# Patient Record
Sex: Male | Born: 1975 | Race: Black or African American | Hispanic: No | Smoking: Never smoker
Health system: Southern US, Community
[De-identification: ages and names within clinical notes are randomized; demographics above are authoritative.]

## PROBLEM LIST (undated history)

## (undated) DIAGNOSIS — S14105A Unspecified injury at C5 level of cervical spinal cord, initial encounter: Secondary | ICD-10-CM

## (undated) DIAGNOSIS — Z21 Asymptomatic human immunodeficiency virus [HIV] infection status: Secondary | ICD-10-CM

## (undated) DIAGNOSIS — K589 Irritable bowel syndrome without diarrhea: Secondary | ICD-10-CM

## (undated) DIAGNOSIS — G629 Polyneuropathy, unspecified: Secondary | ICD-10-CM

## (undated) DIAGNOSIS — F32A Depression, unspecified: Secondary | ICD-10-CM

## (undated) DIAGNOSIS — M4802 Spinal stenosis, cervical region: Secondary | ICD-10-CM

## (undated) DIAGNOSIS — Z8619 Personal history of other infectious and parasitic diseases: Secondary | ICD-10-CM

## (undated) DIAGNOSIS — B20 Human immunodeficiency virus [HIV] disease: Secondary | ICD-10-CM

## (undated) HISTORY — PX: COLONOSCOPY: SHX174

## (undated) HISTORY — PX: HERNIA REPAIR: SHX51

---

## 2006-06-14 ENCOUNTER — Ambulatory Visit: Payer: Self-pay | Admitting: Internal Medicine

## 2006-06-14 ENCOUNTER — Encounter: Admission: RE | Admit: 2006-06-14 | Discharge: 2006-06-14 | Payer: Self-pay | Admitting: Internal Medicine

## 2006-06-14 DIAGNOSIS — B20 Human immunodeficiency virus [HIV] disease: Secondary | ICD-10-CM | POA: Insufficient documentation

## 2006-06-14 LAB — CONVERTED CEMR LAB
AST: 15 units/L (ref 0–37)
Alkaline Phosphatase: 54 units/L (ref 39–117)
BUN: 20 mg/dL (ref 6–23)
Basophils Relative: 1 % (ref 0–1)
Bilirubin Urine: NEGATIVE
Creatinine, Ser: 1.3 mg/dL (ref 0.40–1.50)
Eosinophils Absolute: 0.6 10*3/uL (ref 0.0–0.7)
Eosinophils Relative: 8 % — ABNORMAL HIGH (ref 0–5)
Glucose, Bld: 74 mg/dL (ref 70–99)
HCT: 44.3 % (ref 39.0–52.0)
HCV Ab: NEGATIVE
HIV-1 RNA Quant, Log: 3.16 — ABNORMAL HIGH (ref <1.70)
HIV-1 antibody: POSITIVE — AB
HIV-2 Ab: UNDETERMINED — AB
Hemoglobin, Urine: NEGATIVE
Hemoglobin: 14.8 g/dL (ref 13.0–17.0)
Hep B Core Total Ab: POSITIVE — AB
Hep B S Ab: NEGATIVE
MCHC: 33.4 g/dL (ref 30.0–36.0)
MCV: 87.2 fL (ref 78.0–100.0)
Monocytes Absolute: 0.9 10*3/uL — ABNORMAL HIGH (ref 0.2–0.7)
Monocytes Relative: 12 % — ABNORMAL HIGH (ref 3–11)
Neutrophils Relative %: 46 % (ref 43–77)
Protein, ur: NEGATIVE mg/dL
RBC: 5.08 M/uL (ref 4.22–5.81)
Total Bilirubin: 0.4 mg/dL (ref 0.3–1.2)
Urine Glucose: NEGATIVE mg/dL
pH: 6.5 (ref 5.0–8.0)

## 2006-06-19 DIAGNOSIS — Z8619 Personal history of other infectious and parasitic diseases: Secondary | ICD-10-CM | POA: Insufficient documentation

## 2006-06-19 DIAGNOSIS — B009 Herpesviral infection, unspecified: Secondary | ICD-10-CM | POA: Insufficient documentation

## 2006-07-09 ENCOUNTER — Encounter (INDEPENDENT_AMBULATORY_CARE_PROVIDER_SITE_OTHER): Payer: Self-pay | Admitting: Infectious Diseases

## 2006-07-09 ENCOUNTER — Encounter (INDEPENDENT_AMBULATORY_CARE_PROVIDER_SITE_OTHER): Payer: Self-pay | Admitting: *Deleted

## 2006-07-11 ENCOUNTER — Encounter (INDEPENDENT_AMBULATORY_CARE_PROVIDER_SITE_OTHER): Payer: Self-pay | Admitting: *Deleted

## 2006-07-11 ENCOUNTER — Ambulatory Visit: Payer: Self-pay | Admitting: Internal Medicine

## 2006-07-11 ENCOUNTER — Telehealth: Payer: Self-pay

## 2006-07-11 ENCOUNTER — Telehealth: Payer: Self-pay | Admitting: Internal Medicine

## 2006-07-11 DIAGNOSIS — K4090 Unilateral inguinal hernia, without obstruction or gangrene, not specified as recurrent: Secondary | ICD-10-CM | POA: Insufficient documentation

## 2006-07-18 ENCOUNTER — Ambulatory Visit: Payer: Self-pay | Admitting: Internal Medicine

## 2006-07-25 ENCOUNTER — Ambulatory Visit: Payer: Self-pay | Admitting: Internal Medicine

## 2006-08-01 ENCOUNTER — Encounter (INDEPENDENT_AMBULATORY_CARE_PROVIDER_SITE_OTHER): Payer: Self-pay | Admitting: *Deleted

## 2006-09-10 ENCOUNTER — Ambulatory Visit: Payer: Self-pay | Admitting: Internal Medicine

## 2006-09-10 ENCOUNTER — Encounter: Admission: RE | Admit: 2006-09-10 | Discharge: 2006-09-10 | Payer: Self-pay | Admitting: Internal Medicine

## 2006-09-10 LAB — CONVERTED CEMR LAB
Alkaline Phosphatase: 53 units/L (ref 39–117)
BUN: 18 mg/dL (ref 6–23)
CO2: 24 meq/L (ref 19–32)
Creatinine, Ser: 1.13 mg/dL (ref 0.40–1.50)
Eosinophils Absolute: 0.3 10*3/uL (ref 0.0–0.7)
Eosinophils Relative: 6 % — ABNORMAL HIGH (ref 0–5)
Glucose, Bld: 80 mg/dL (ref 70–99)
HCT: 43.3 % (ref 39.0–52.0)
HIV 1 RNA Quant: 1730 copies/mL — ABNORMAL HIGH (ref <50)
HIV-1 RNA Quant, Log: 3.24 — ABNORMAL HIGH (ref <1.70)
Hemoglobin: 14.4 g/dL (ref 13.0–17.0)
Lymphocytes Relative: 48 % — ABNORMAL HIGH (ref 12–46)
Lymphs Abs: 2.5 10*3/uL (ref 0.7–3.3)
MCV: 88.5 fL (ref 78.0–100.0)
Monocytes Absolute: 0.6 10*3/uL (ref 0.2–0.7)
Platelets: 256 10*3/uL (ref 150–400)
Sodium: 142 meq/L (ref 135–145)
Total Bilirubin: 0.4 mg/dL (ref 0.3–1.2)
Total Protein: 6.8 g/dL (ref 6.0–8.3)
WBC: 5.2 10*3/uL (ref 4.0–10.5)

## 2006-10-01 ENCOUNTER — Encounter (INDEPENDENT_AMBULATORY_CARE_PROVIDER_SITE_OTHER): Payer: Self-pay | Admitting: *Deleted

## 2006-10-01 LAB — CONVERTED CEMR LAB: Hep B S Ab: POSITIVE

## 2006-10-02 ENCOUNTER — Ambulatory Visit: Payer: Self-pay | Admitting: Internal Medicine

## 2006-10-02 DIAGNOSIS — R21 Rash and other nonspecific skin eruption: Secondary | ICD-10-CM | POA: Insufficient documentation

## 2006-10-05 ENCOUNTER — Telehealth: Payer: Self-pay | Admitting: Internal Medicine

## 2007-01-01 ENCOUNTER — Encounter: Admission: RE | Admit: 2007-01-01 | Discharge: 2007-01-01 | Payer: Self-pay | Admitting: Internal Medicine

## 2007-01-01 ENCOUNTER — Ambulatory Visit: Payer: Self-pay | Admitting: Internal Medicine

## 2007-01-01 LAB — CONVERTED CEMR LAB
AST: 21 units/L (ref 0–37)
Albumin: 4.3 g/dL (ref 3.5–5.2)
BUN: 21 mg/dL (ref 6–23)
Basophils Relative: 1 % (ref 0–1)
CO2: 24 meq/L (ref 19–32)
Calcium: 9.2 mg/dL (ref 8.4–10.5)
Chloride: 106 meq/L (ref 96–112)
Creatinine, Ser: 1.19 mg/dL (ref 0.40–1.50)
HIV-1 RNA Quant, Log: 2.96 — ABNORMAL HIGH (ref <1.70)
Hemoglobin: 13.8 g/dL (ref 13.0–17.0)
Lymphocytes Relative: 43 % (ref 12–46)
Lymphs Abs: 2.7 10*3/uL (ref 0.7–4.0)
MCHC: 33.1 g/dL (ref 30.0–36.0)
Monocytes Absolute: 0.7 10*3/uL (ref 0.1–1.0)
Monocytes Relative: 11 % (ref 3–12)
Neutro Abs: 2.4 10*3/uL (ref 1.7–7.7)
Neutrophils Relative %: 39 % — ABNORMAL LOW (ref 43–77)
Potassium: 3.7 meq/L (ref 3.5–5.3)
RBC: 4.67 M/uL (ref 4.22–5.81)
WBC: 6.2 10*3/uL (ref 4.0–10.5)

## 2007-01-14 ENCOUNTER — Encounter (INDEPENDENT_AMBULATORY_CARE_PROVIDER_SITE_OTHER): Payer: Self-pay | Admitting: *Deleted

## 2007-01-15 ENCOUNTER — Encounter (INDEPENDENT_AMBULATORY_CARE_PROVIDER_SITE_OTHER): Payer: Self-pay | Admitting: *Deleted

## 2007-01-15 ENCOUNTER — Encounter: Payer: Self-pay | Admitting: Internal Medicine

## 2007-01-16 ENCOUNTER — Ambulatory Visit: Payer: Self-pay | Admitting: Internal Medicine

## 2007-04-10 ENCOUNTER — Telehealth: Payer: Self-pay | Admitting: Internal Medicine

## 2007-04-12 ENCOUNTER — Ambulatory Visit: Payer: Self-pay | Admitting: Internal Medicine

## 2007-07-03 ENCOUNTER — Ambulatory Visit: Payer: Self-pay | Admitting: Internal Medicine

## 2007-07-03 ENCOUNTER — Encounter: Admission: RE | Admit: 2007-07-03 | Discharge: 2007-07-03 | Payer: Self-pay | Admitting: Internal Medicine

## 2007-07-03 LAB — CONVERTED CEMR LAB
ALT: 11 units/L (ref 0–53)
AST: 20 units/L (ref 0–37)
Alkaline Phosphatase: 57 units/L (ref 39–117)
Basophils Absolute: 0 10*3/uL (ref 0.0–0.1)
Eosinophils Absolute: 0.3 10*3/uL (ref 0.0–0.7)
Eosinophils Relative: 5 % (ref 0–5)
HCT: 42 % (ref 39.0–52.0)
MCV: 87 fL (ref 78.0–100.0)
Neutrophils Relative %: 43 % (ref 43–77)
Platelets: 248 10*3/uL (ref 150–400)
Potassium: 4.1 meq/L (ref 3.5–5.3)
RDW: 13.7 % (ref 11.5–15.5)
Sodium: 143 meq/L (ref 135–145)
Total Bilirubin: 0.5 mg/dL (ref 0.3–1.2)
Total Protein: 7.5 g/dL (ref 6.0–8.3)

## 2007-07-17 ENCOUNTER — Ambulatory Visit: Payer: Self-pay | Admitting: Internal Medicine

## 2007-09-02 ENCOUNTER — Telehealth: Payer: Self-pay | Admitting: Internal Medicine

## 2007-09-03 ENCOUNTER — Ambulatory Visit: Payer: Self-pay | Admitting: Internal Medicine

## 2007-09-03 DIAGNOSIS — K029 Dental caries, unspecified: Secondary | ICD-10-CM | POA: Insufficient documentation

## 2007-09-03 DIAGNOSIS — K047 Periapical abscess without sinus: Secondary | ICD-10-CM | POA: Insufficient documentation

## 2007-11-06 ENCOUNTER — Telehealth: Payer: Self-pay | Admitting: Internal Medicine

## 2007-11-07 ENCOUNTER — Telehealth: Payer: Self-pay | Admitting: Internal Medicine

## 2007-11-11 ENCOUNTER — Ambulatory Visit: Payer: Self-pay | Admitting: Internal Medicine

## 2007-11-21 ENCOUNTER — Encounter (INDEPENDENT_AMBULATORY_CARE_PROVIDER_SITE_OTHER): Payer: Self-pay | Admitting: *Deleted

## 2007-12-03 ENCOUNTER — Ambulatory Visit: Payer: Self-pay | Admitting: Infectious Diseases

## 2007-12-03 ENCOUNTER — Telehealth (INDEPENDENT_AMBULATORY_CARE_PROVIDER_SITE_OTHER): Payer: Self-pay | Admitting: *Deleted

## 2007-12-04 ENCOUNTER — Encounter (INDEPENDENT_AMBULATORY_CARE_PROVIDER_SITE_OTHER): Payer: Self-pay | Admitting: *Deleted

## 2008-01-14 ENCOUNTER — Ambulatory Visit: Payer: Self-pay | Admitting: Infectious Diseases

## 2008-01-14 ENCOUNTER — Encounter: Payer: Self-pay | Admitting: Internal Medicine

## 2008-01-14 LAB — CONVERTED CEMR LAB
ALT: 13 units/L (ref 0–53)
Basophils Absolute: 0 10*3/uL (ref 0.0–0.1)
Basophils Relative: 0 % (ref 0–1)
CO2: 25 meq/L (ref 19–32)
Creatinine, Ser: 1.45 mg/dL (ref 0.40–1.50)
Eosinophils Absolute: 0.3 10*3/uL (ref 0.0–0.7)
Eosinophils Relative: 4 % (ref 0–5)
HCT: 43.5 % (ref 39.0–52.0)
HIV 1 RNA Quant: 2050 copies/mL — ABNORMAL HIGH (ref <48)
Hemoglobin: 14.5 g/dL (ref 13.0–17.0)
Lymphocytes Relative: 39 % (ref 12–46)
MCHC: 33.3 g/dL (ref 30.0–36.0)
MCV: 87.5 fL (ref 78.0–100.0)
Monocytes Absolute: 0.7 10*3/uL (ref 0.1–1.0)
RDW: 13.7 % (ref 11.5–15.5)
Total Bilirubin: 0.4 mg/dL (ref 0.3–1.2)

## 2008-01-21 ENCOUNTER — Telehealth (INDEPENDENT_AMBULATORY_CARE_PROVIDER_SITE_OTHER): Payer: Self-pay | Admitting: *Deleted

## 2008-01-21 ENCOUNTER — Encounter (INDEPENDENT_AMBULATORY_CARE_PROVIDER_SITE_OTHER): Payer: Self-pay | Admitting: *Deleted

## 2008-01-21 ENCOUNTER — Telehealth: Payer: Self-pay | Admitting: Internal Medicine

## 2008-01-31 ENCOUNTER — Ambulatory Visit: Payer: Self-pay | Admitting: Internal Medicine

## 2008-04-10 ENCOUNTER — Ambulatory Visit: Payer: Self-pay | Admitting: Internal Medicine

## 2008-04-10 LAB — CONVERTED CEMR LAB
ALT: 17 units/L (ref 0–53)
AST: 24 units/L (ref 0–37)
Albumin: 4.6 g/dL (ref 3.5–5.2)
BUN: 20 mg/dL (ref 6–23)
Basophils Relative: 0 % (ref 0–1)
Calcium: 9.4 mg/dL (ref 8.4–10.5)
Chloride: 106 meq/L (ref 96–112)
Eosinophils Absolute: 0.4 10*3/uL (ref 0.0–0.7)
MCHC: 34.9 g/dL (ref 30.0–36.0)
MCV: 86.5 fL (ref 78.0–100.0)
Neutrophils Relative %: 40 % — ABNORMAL LOW (ref 43–77)
Platelets: 236 10*3/uL (ref 150–400)
Potassium: 3.9 meq/L (ref 3.5–5.3)
RDW: 12.9 % (ref 11.5–15.5)
WBC: 6.7 10*3/uL (ref 4.0–10.5)

## 2008-05-01 ENCOUNTER — Ambulatory Visit: Payer: Self-pay | Admitting: Internal Medicine

## 2008-05-01 ENCOUNTER — Encounter (INDEPENDENT_AMBULATORY_CARE_PROVIDER_SITE_OTHER): Payer: Self-pay | Admitting: Licensed Clinical Social Worker

## 2008-05-01 DIAGNOSIS — G4700 Insomnia, unspecified: Secondary | ICD-10-CM | POA: Insufficient documentation

## 2008-05-20 ENCOUNTER — Ambulatory Visit: Payer: Self-pay | Admitting: Internal Medicine

## 2008-05-20 DIAGNOSIS — N342 Other urethritis: Secondary | ICD-10-CM | POA: Insufficient documentation

## 2008-05-20 LAB — CONVERTED CEMR LAB
Glucose, Urine, Semiquant: NEGATIVE
Ketones, urine, test strip: NEGATIVE
Nitrite: NEGATIVE
Protein, U semiquant: 30
Urobilinogen, UA: 0.2

## 2008-05-22 ENCOUNTER — Encounter (INDEPENDENT_AMBULATORY_CARE_PROVIDER_SITE_OTHER): Payer: Self-pay | Admitting: Licensed Clinical Social Worker

## 2008-05-22 LAB — CONVERTED CEMR LAB: GC Probe Amp, Urine: POSITIVE — AB

## 2008-05-28 ENCOUNTER — Telehealth: Payer: Self-pay | Admitting: Internal Medicine

## 2008-07-30 ENCOUNTER — Ambulatory Visit: Payer: Self-pay | Admitting: Internal Medicine

## 2008-07-30 LAB — CONVERTED CEMR LAB
ALT: 13 units/L (ref 0–53)
AST: 18 units/L (ref 0–37)
Basophils Absolute: 0 10*3/uL (ref 0.0–0.1)
Basophils Relative: 0 % (ref 0–1)
CO2: 26 meq/L (ref 19–32)
Chloride: 106 meq/L (ref 96–112)
Eosinophils Absolute: 0.3 10*3/uL (ref 0.0–0.7)
HIV-1 RNA Quant, Log: 3.13 — ABNORMAL HIGH (ref <1.68)
Lymphs Abs: 2.5 10*3/uL (ref 0.7–4.0)
MCV: 86.2 fL (ref 78.0–100.0)
Neutrophils Relative %: 50 % (ref 43–77)
Platelets: 235 10*3/uL (ref 150–400)
RDW: 13.5 % (ref 11.5–15.5)
Sodium: 142 meq/L (ref 135–145)
Total Bilirubin: 0.4 mg/dL (ref 0.3–1.2)
Total Protein: 7.3 g/dL (ref 6.0–8.3)
WBC: 7 10*3/uL (ref 4.0–10.5)

## 2008-08-14 ENCOUNTER — Ambulatory Visit: Payer: Self-pay | Admitting: Internal Medicine

## 2008-08-18 ENCOUNTER — Telehealth: Payer: Self-pay | Admitting: Internal Medicine

## 2008-12-09 ENCOUNTER — Ambulatory Visit: Payer: Self-pay | Admitting: Internal Medicine

## 2008-12-09 LAB — CONVERTED CEMR LAB
BUN: 21 mg/dL (ref 6–23)
CO2: 23 meq/L (ref 19–32)
Calcium: 9.8 mg/dL (ref 8.4–10.5)
Chloride: 107 meq/L (ref 96–112)
Creatinine, Ser: 1.15 mg/dL (ref 0.40–1.50)
Eosinophils Absolute: 0.3 10*3/uL (ref 0.0–0.7)
Eosinophils Relative: 5 % (ref 0–5)
Glucose, Bld: 75 mg/dL (ref 70–99)
HCT: 44.1 % (ref 39.0–52.0)
HIV-1 RNA Quant, Log: 3.2 — ABNORMAL HIGH (ref <1.68)
Lymphocytes Relative: 41 % (ref 12–46)
Lymphs Abs: 2.4 10*3/uL (ref 0.7–4.0)
MCV: 86.5 fL (ref >=78.0)
Monocytes Relative: 12 % (ref 3–12)
RBC: 5.1 M/uL (ref 4.22–5.81)
Total Bilirubin: 0.3 mg/dL (ref 0.3–1.2)
WBC: 6 10*3/uL (ref 4.0–10.5)

## 2009-01-01 ENCOUNTER — Ambulatory Visit: Payer: Self-pay | Admitting: Internal Medicine

## 2009-02-05 ENCOUNTER — Telehealth: Payer: Self-pay | Admitting: Internal Medicine

## 2009-03-19 ENCOUNTER — Encounter: Payer: Self-pay | Admitting: Internal Medicine

## 2009-03-24 ENCOUNTER — Ambulatory Visit: Payer: Self-pay | Admitting: Internal Medicine

## 2009-03-24 LAB — CONVERTED CEMR LAB
ALT: <8 units/L (ref 0–53)
AST: 15 units/L (ref 0–37)
Albumin: 4.7 g/dL (ref 3.5–5.2)
CO2: 24 meq/L (ref 19–32)
Calcium: 9.2 mg/dL (ref 8.4–10.5)
Chloride: 103 meq/L (ref 96–112)
Eosinophils Absolute: 0.2 10*3/uL (ref 0.0–0.7)
HCT: 43.9 % (ref 39.0–52.0)
HIV 1 RNA Quant: 2760 copies/mL — ABNORMAL HIGH (ref <48)
Lymphocytes Relative: 45 % (ref 12–46)
Lymphs Abs: 2.4 10*3/uL (ref 0.7–4.0)
MCV: 85.7 fL (ref >=78.0)
Monocytes Relative: 9 % (ref 3–12)
Neutrophils Relative %: 42 % — ABNORMAL LOW (ref 43–77)
Platelets: 249 10*3/uL (ref 150–400)
Potassium: 3.9 meq/L (ref 3.5–5.3)
RBC: 5.12 M/uL (ref 4.22–5.81)
Total Protein: 7.6 g/dL (ref 6.0–8.3)
WBC: 5.3 10*3/uL (ref 4.0–10.5)

## 2009-04-12 ENCOUNTER — Ambulatory Visit: Payer: Self-pay | Admitting: Internal Medicine

## 2009-04-26 ENCOUNTER — Telehealth: Payer: Self-pay | Admitting: Internal Medicine

## 2009-05-12 ENCOUNTER — Ambulatory Visit: Payer: Self-pay | Admitting: Internal Medicine

## 2009-05-27 ENCOUNTER — Encounter: Payer: Self-pay | Admitting: Internal Medicine

## 2009-06-16 ENCOUNTER — Telehealth: Payer: Self-pay | Admitting: Internal Medicine

## 2009-11-12 ENCOUNTER — Ambulatory Visit: Payer: Self-pay | Admitting: Internal Medicine

## 2009-12-20 ENCOUNTER — Encounter (INDEPENDENT_AMBULATORY_CARE_PROVIDER_SITE_OTHER): Payer: Self-pay | Admitting: *Deleted

## 2010-02-16 NOTE — Assessment & Plan Note (Signed)
Summary: 3 MONTH CHECK UP/CFB   Primary Provider:  Yisroel Ramming MD  CC:  follow-up visit and lab results.  History of Present Illness: Pt her for lab results.  He feels well. No new problems.  Preventive Screening-Counseling & Management  Alcohol-Tobacco     Alcohol drinks/day: 0     Smoking Status: never     Packs/Day: occ  Caffeine-Diet-Exercise     Caffeine use/day: sodas     Does Patient Exercise: no     Exercise (avg: min/session): <30  Safety-Violence-Falls     Seat Belt Use: yes      Sexual History:  currently monogamous.     Current Allergies (reviewed today): No known allergies  Past History:  Past Medical History: Last updated: 12/03/2007 HIV disease dental abscess  Review of Systems  The patient denies anorexia, fever, and weight loss.    Vital Signs:  Patient profile:   35 year old male Height:      74 inches (187.96 cm) Weight:      138.7 pounds (63.05 kg) BMI:     17.87 Temp:     98.5 degrees F (36.94 degrees C) oral Pulse rate:   63 / minute BP sitting:   142 / 90  (left arm)  Vitals Entered By: Wendall Mola CMA Duncan Dull) (April 12, 2009 3:06 PM) CC: follow-up visit, lab results Is Patient Diabetic? No Pain Assessment Patient in pain? no      Nutritional Status BMI of < 19 = underweight Nutritional Status Detail appetite "normal"  Does patient need assistance? Functional Status Self care Ambulation Normal   Physical Exam  General:  alert, well-developed, well-nourished, and well-hydrated.   Head:  normocephalic and atraumatic.   Mouth:  pharynx pink and moist.   Lungs:  normal breath sounds.     Impression & Recommendations:  Problem # 1:  HIV INFECTION (ICD-042) Pt.s CD4ct dropped to 390. Discussed starting antiretroviral therapy.  Pt agrees. Discussed treatment options.  Will add a genotype and have patient return in 2-3 weeks to get started.  Refer to Byrd Hesselbach to get him enrolled in ADAP. Orders: Est. Patient Level  III ([address])  Diagnostics Reviewed:  HIV: HIV positive - not AIDS (08/14/2008)   HIV-Western blot: Positive (06/14/2006)   CD4: 390 (03/25/2009)   WBC: 5.3 (03/24/2009)   Hgb: 14.8 (03/24/2009)   HCT: 43.9 (03/24/2009)   Platelets: 249 (03/24/2009) HIV-1 RNA: 2760 (03/24/2009)   HBSAg: NEG (06/14/2006)  Other Orders: T-HIV Genotype ([id number])  Patient Instructions: 1)  Please schedule a follow-up appointment in 2 - 3 weeks. Process Orders Check Orders Results:     [facility]: ABN not required for this insurance Tests Sent for requisitioning (April 12, 2009 4:22 PM):     04/12/2009: [facility] -- T-HIV Genotype [phone] (signed)

## 2010-02-16 NOTE — Progress Notes (Signed)
Summary: new script/drug coverage now.  Phone Note Call from Patient   Caller: Spouse Details for Reason: Pt husband now has pharmacy coverage. Summary of Call: Pt's wife called to inform me that her husband now has active drug coverage through [employer] and would like his prescription to be [facility] [address] [address]  I informed her that I will cancel the ADAP we started.  Patient was currently on the waitlist. Initial call taken by: Paulo Fruit  BS,CPht II,MPH,  June 16, 2009 4:36 PM    Prescriptions: ATRIPLA 600-200-300 MG TABS (EFAVIRENZ-EMTRICITAB-TENOFOVIR) Take 1 tablet by mouth at bedtime  #30 x 6   Entered by:   Paulo Fruit  BS,CPht II,MPH   Authorized by:   Yisroel Ramming MD   Signed by:   Paulo Fruit  BS,CPht II,MPH on 06/16/2009   Method used:   [facility] to        [phone][facility] (retail)       [address] [place], [place]  [address]       Ph: [phone]       Fax: [phone]   RxID:   [id number]  Paulo Fruit  BS,CPht II,MPH  June 16, 2009 4:37 PM

## 2010-02-16 NOTE — Miscellaneous (Signed)
Summary: ADAP [place]   Clinical Lists Changes  Observations: Added new observation of AIDSDAP: Yes 2011 (12/20/2009 15:18)

## 2010-02-16 NOTE — Assessment & Plan Note (Signed)
Summary: 3WK F/U/VS   Primary Provider:  Yisroel Ramming MD  CC:  follow-up visit and get started on meds.  History of Present Illness: Pt ready to get started on HIV medications. They brought all the information needed to get him enrolled in ADAP. No new problems.  Preventive Screening-Counseling & Management  Alcohol-Tobacco     Alcohol drinks/day: 0     Smoking Status: never     Packs/Day: occ  Caffeine-Diet-Exercise     Caffeine use/day: sodas     Does Patient Exercise: yes     Type of exercise: walking     Exercise (avg: min/session): <30     Times/week: 7  Safety-Violence-Falls     Seat Belt Use: yes      Sexual History:  currently monogamous.        Drug Use:  No.        Blood Transfusions:  no.        Travel History:  no.     Current Allergies (reviewed today): No known allergies  Past History:  Past Medical History: Last updated: 12/03/2007 HIV disease dental abscess  Review of Systems  The patient denies anorexia, fever, and weight loss.    Vital Signs:  Patient profile:   35 year old male Height:      74 inches (187.96 cm) Weight:      137.8 pounds (62.64 kg) BMI:     17.76 Temp:     98.1 degrees F (36.72 degrees C) oral Pulse rate:   81 / minute BP sitting:   119 / 78  (left arm)  Vitals Entered By: Wendall Mola CMA Duncan Dull) (May 12, 2009 3:27 PM) CC: follow-up visit, get started on meds Is Patient Diabetic? No Pain Assessment Patient in pain? no      Nutritional Status BMI of < 19 = underweight Nutritional Status Detail appetite "ok"  Does patient need assistance? Functional Status Self care Ambulation Normal   Physical Exam  General:  alert, well-developed, well-nourished, and well-hydrated.   Head:  normocephalic and atraumatic.   Mouth:  pharynx pink and moist.   Lungs:  normal breath sounds.     Impression & Recommendations:  Problem # 1:  HIV DISEASE (ICD-042)  Pt with niave virus.  Discussed treatment  options.  Pt would like to start Atripla. Discussed potential side effects.  Will have him see Byrd Hesselbach to get enrolled in ADAP. Once enrolled will start Atripla and then return in 6 weeks for labs. Diagnostics Reviewed:  HIV: HIV positive - not AIDS (08/14/2008)   HIV-Western blot: Positive (06/14/2006)   CD4: 390 (03/25/2009)   WBC: 5.3 (03/24/2009)   Hgb: 14.8 (03/24/2009)   HCT: 43.9 (03/24/2009)   Platelets: 249 (03/24/2009) HIV genotype: * (04/12/2009)   HIV-1 RNA: 2760 (03/24/2009)   HBSAg: NEG (06/14/2006)  Orders: Est. Patient Level IV ([address])  Medications Added to Medication List This Visit: 1)  Atripla 600-200-300 Mg Tabs (Efavirenz-emtricitab-tenofovir) .... Take 1 tablet by mouth at bedtime

## 2010-02-16 NOTE — Assessment & Plan Note (Signed)
Summary: FLU SHOT[MKJ]  Prior Medications: ATRIPLA 600-200-300 MG TABS (EFAVIRENZ-EMTRICITAB-TENOFOVIR) Take 1 tablet by mouth at bedtime Current Allergies: No known allergies  Immunizations Administered:  Influenza Vaccine # 1:    Vaccine Type: Fluvax 3+    Site: right deltoid    Mfr: GlaxoSmithKline    Dose: 0.5 ml    Route: IM    Given by: Starleen Arms CMA    Exp. Date: 04/17/2010    Lot #: [id number]    VIS given: 08/10/09 version given November 12, 2009.  Flu Vaccine Consent Questions:    Do you have a history of severe allergic reactions to this vaccine? no    Any prior history of allergic reactions to egg and/or gelatin? no    Do you have a sensitivity to the preservative Thimersol? no    Do you have a past history of Guillan-Barre Syndrome? no    Do you currently have an acute febrile illness? no    Have you ever had a severe reaction to latex? no    Vaccine information given and explained to patient? yes  Orders Added: 1)  Flu Vaccine 67yrs + [90658] 2)  Admin 1st Vaccine [81191]

## 2010-02-16 NOTE — Miscellaneous (Signed)
Summary: RW Update  Clinical Lists Changes  Observations: Added new observation of SYPHILIS TX: No (03/19/2009 12:27)

## 2010-02-16 NOTE — Progress Notes (Signed)
Summary: Cough and Sinus drainage   Phone Note Call from Patient   Caller: wife- Hector Armstrong  [phone] Summary of Call: Productive cough  present X 6 days with sinus congestion . No relief with OTC meds .  No fever. Please advise  Initial call taken by: Tomasita Morrow RN,  February 05, 2009 2:14 PM  Follow-up for Phone Call        doxycycline 100mg  two times a day for 10 days  (on $4 rx plan) Follow-up by: Yisroel Ramming MD,  February 05, 2009 3:44 PM  Additional Follow-up for Phone Call Additional follow up Details #1::        Spoke with patient.  Med to pharm.  Tomasita Morrow RN  February 05, 2009 4:33 PM     New/Updated Medications: DOXYCYCLINE HYCLATE 100 MG CAPS (DOXYCYCLINE HYCLATE) Take 1 capsule by mouth two times a day DOXYCYCLINE HYCLATE 100 MG CAPS (DOXYCYCLINE HYCLATE) Take 1 capsule by mouth two times a day for 10 days Prescriptions: DOXYCYCLINE HYCLATE 100 MG CAPS (DOXYCYCLINE HYCLATE) Take 1 capsule by mouth two times a day for 10 days  #20 x 0   Entered by:   Tomasita Morrow RN   Authorized by:   Yisroel Ramming MD   Signed by:   Tomasita Morrow RN on 02/05/2009   Method used:   Electronically to        [facility] (retail)       [address]       [place], [place]  [address]       Ph: [phone]       Fax: [phone]   RxID:   [id number]

## 2010-02-16 NOTE — Progress Notes (Signed)
Summary: Questions before starting HAART.  Phone Note Call from Patient   Caller: Spouse Summary of Call: It was recently discovered they had a bed bug infestation.  The patients wife had several bite sites but Natanael did not have any.   His wife states that his CD-4 dropped at the last OV and Dr Philipp Deputy wanted him to start medications.  She wonders if the drop in the CD-4 is associated with the bed bug infestation.  She is hesitant about starting the medications if it is unnecessary and wonders if we need to repeat labs.  Please advise. Tomasita Morrow RN  April 26, 2009 12:09 PM   Follow-up for Phone Call        not related to bed bugs If he is not ready to start HIV meds then he can repeat labs in 6 weeks.  His CD4ct has steadily been dropping so I believe the last reading was accurate. Follow-up by: Yisroel Ramming MD,  April 26, 2009 2:52 PM  Additional Follow-up for Phone Call Additional follow up Details #1::        left message to return call. Tomasita Morrow RN  April 30, 2009 11:05 AM

## 2010-02-16 NOTE — Miscellaneous (Signed)
Summary: RW Fiancial Update Paulo Fruit)  Clinical Lists Changes  Observations: Added new observation of PCTFPL: 284.65  (12/20/2009 15:24) Added new observation of HOUSEINCOME: 13244  (12/20/2009 15:24) Added new observation of FINASSESSDT: 05/12/2009  (12/20/2009 15:24)  Appended Document: RW Fiancial Update Byrd Hesselbach [place]) patient was approved for RW on 05/12/09 - expires 05/13/10

## 2010-02-16 NOTE — Letter (Signed)
Summary: [facility]  [address]   [place], [place] [address]   Phone: [phone]  Fax: [phone]    05/27/2009   To whom it may concern:  Kelan Pritt was diagnosed with HIV in May 2008. He is currently in  treatment at the [facility].        Sincerely,  Christoper Tamarick

## 2010-04-20 LAB — T-HELPER CELL (CD4) - (RCID CLINIC ONLY): CD4 T Cell Abs: 430 uL (ref 400–2700)

## 2010-04-28 LAB — T-HELPER CELL (CD4) - (RCID CLINIC ONLY)
CD4 % Helper T Cell: 21 % — ABNORMAL LOW (ref 33–55)
CD4 T Cell Abs: 630 uL (ref 400–2700)

## 2010-07-26 ENCOUNTER — Other Ambulatory Visit (INDEPENDENT_AMBULATORY_CARE_PROVIDER_SITE_OTHER): Payer: PRIVATE HEALTH INSURANCE

## 2010-07-26 DIAGNOSIS — B20 Human immunodeficiency virus [HIV] disease: Secondary | ICD-10-CM

## 2010-07-26 LAB — CBC WITH DIFFERENTIAL/PLATELET
Hemoglobin: 14.4 g/dL (ref 13.0–17.0)
Lymphocytes Relative: 35 % (ref 12–46)
Lymphs Abs: 2.4 10*3/uL (ref 0.7–4.0)
MCH: 29.8 pg (ref 26.0–34.0)
Monocytes Relative: 12 % (ref 3–12)
Neutro Abs: 3.1 10*3/uL (ref 1.7–7.7)
Neutrophils Relative %: 46 % (ref 43–77)
RBC: 4.84 MIL/uL (ref 4.22–5.81)
WBC: 6.7 10*3/uL (ref 4.0–10.5)

## 2010-07-27 LAB — T-HELPER CELL (CD4) - (RCID CLINIC ONLY)
CD4 % Helper T Cell: 26 % — ABNORMAL LOW (ref 33–55)
CD4 T Cell Abs: 590 uL (ref 400–2700)

## 2010-07-27 LAB — HIV-1 RNA QUANT-NO REFLEX-BLD: HIV-1 RNA Quant, Log: <1.3 {Log} (ref <1.30)

## 2010-07-27 LAB — COMPLETE METABOLIC PANEL WITH GFR
ALT: 8 U/L (ref 0–53)
Albumin: 4.6 g/dL (ref 3.5–5.2)
CO2: 25 mEq/L (ref 19–32)
GFR, Est African American: >60 mL/min (ref >60)
GFR, Est Non African American: >60 mL/min (ref >60)
Glucose, Bld: 82 mg/dL (ref 70–99)
Potassium: 4.2 mEq/L (ref 3.5–5.3)
Sodium: 139 mEq/L (ref 135–145)
Total Protein: 7.3 g/dL (ref 6.0–8.3)

## 2010-07-27 LAB — GC/CHLAMYDIA PROBE AMP, URINE: Chlamydia, Swab/Urine, PCR: NEGATIVE

## 2010-08-09 ENCOUNTER — Encounter: Payer: Self-pay | Admitting: Internal Medicine

## 2010-08-09 ENCOUNTER — Ambulatory Visit (INDEPENDENT_AMBULATORY_CARE_PROVIDER_SITE_OTHER): Payer: PRIVATE HEALTH INSURANCE | Admitting: Internal Medicine

## 2010-08-09 VITALS — BP 151/86 | HR 84 | Temp 98.3°F | Ht 73.0 in | Wt 134.5 lb

## 2010-08-09 DIAGNOSIS — B20 Human immunodeficiency virus [HIV] disease: Secondary | ICD-10-CM

## 2010-08-09 NOTE — Assessment & Plan Note (Signed)
HIV - tolerating well and now undetectable.  Patient doing very well.  I discussed prevention with condoms and he was supplied with them.  I discussed closer follow up.  He will return in 4 months.  I emphasized continued compliance and congratulated him on his current compliance.  I also discussed long term effects of meds and HIV including heart and kidney risks and the need to continue to monitor this and encouraged exercise and healthy eating.

## 2010-08-09 NOTE — Progress Notes (Signed)
  Subjective:    Patient ID: Hector Armstrong, male    DOB: 03-31-1975, 35 y.o.   MRN: [id number]  HPI 35 yo with history of HIV, on Atripla.  No appt for > 1 year, but has not been out of his meds.  Is undectable today and CD4 is good.  Tolerating the meds well and reports 100% compliance.  No dysphagia, no fever, no hospitalizations.  Is unemployed and looking for work.     Previously, he has had low level viremia from 2-3 log, this is his first time undetectable.  Today he has no complaints.     Review of Systems  Constitutional: Negative.   HENT: Negative.   Eyes: Negative.   Respiratory: Negative.   Cardiovascular: Negative.   Gastrointestinal: Negative.   Genitourinary: Negative.   Musculoskeletal: Negative.   Skin: Negative.   Neurological: Negative.   Hematological: Negative.   Psychiatric/Behavioral: Negative.        Objective:   Physical Exam  Constitutional: He is oriented to person, place, and time. He appears well-developed and well-nourished. No distress.       Thin   HENT:  Mouth/Throat: No oropharyngeal exudate.  Neck: Normal range of motion. Neck supple.  Cardiovascular: Normal rate, regular rhythm and normal heart sounds.  Exam reveals no gallop and no friction rub.   No murmur heard. Pulmonary/Chest: Effort normal and breath sounds normal. No respiratory distress. He has no wheezes.  Abdominal: Soft. Bowel sounds are normal. He exhibits no distension.  Lymphadenopathy:    He has no cervical adenopathy.  Neurological: He is alert and oriented to person, place, and time.  Skin: Skin is warm and dry. No erythema.  Psychiatric: He has a normal mood and affect. His behavior is normal.          Assessment & Plan:

## 2010-10-13 LAB — T-HELPER CELL (CD4) - (RCID CLINIC ONLY): CD4 % Helper T Cell: 36

## 2010-10-17 ENCOUNTER — Ambulatory Visit (INDEPENDENT_AMBULATORY_CARE_PROVIDER_SITE_OTHER): Payer: PRIVATE HEALTH INSURANCE

## 2010-10-17 DIAGNOSIS — Z0000 Encounter for general adult medical examination without abnormal findings: Secondary | ICD-10-CM

## 2010-10-17 DIAGNOSIS — Z23 Encounter for immunization: Secondary | ICD-10-CM

## 2010-10-21 LAB — T-HELPER CELL (CD4) - (RCID CLINIC ONLY)
CD4 % Helper T Cell: 26 % — ABNORMAL LOW (ref 33–55)
CD4 T Cell Abs: 740 uL (ref 400–2700)

## 2011-01-31 ENCOUNTER — Telehealth: Payer: Self-pay | Admitting: *Deleted

## 2011-01-31 NOTE — Telephone Encounter (Signed)
He had it in 05/2006. Son recently had pna & now dtr has it. Wife is concerned about her spouse. Wanted to know if he can get it early. I checked with Dr. Ninetta Lights & he said ok. Transferred to front to make an appt

## 2011-02-06 ENCOUNTER — Ambulatory Visit: Payer: PRIVATE HEALTH INSURANCE

## 2011-03-29 ENCOUNTER — Other Ambulatory Visit: Payer: Self-pay | Admitting: *Deleted

## 2011-03-29 DIAGNOSIS — B20 Human immunodeficiency virus [HIV] disease: Secondary | ICD-10-CM

## 2011-03-29 MED ORDER — EFAVIRENZ-EMTRICITAB-TENOFOVIR 600-200-300 MG PO TABS: 1.0000 | ORAL_TABLET | Freq: Every day | ORAL | Status: DC

## 2011-04-17 ENCOUNTER — Ambulatory Visit: Payer: PRIVATE HEALTH INSURANCE

## 2011-04-21 ENCOUNTER — Ambulatory Visit (INDEPENDENT_AMBULATORY_CARE_PROVIDER_SITE_OTHER): Payer: PRIVATE HEALTH INSURANCE | Admitting: *Deleted

## 2011-04-21 DIAGNOSIS — Z23 Encounter for immunization: Secondary | ICD-10-CM

## 2011-04-21 DIAGNOSIS — B20 Human immunodeficiency virus [HIV] disease: Secondary | ICD-10-CM

## 2011-04-28 ENCOUNTER — Other Ambulatory Visit: Payer: PRIVATE HEALTH INSURANCE

## 2011-05-02 ENCOUNTER — Telehealth: Payer: Self-pay | Admitting: *Deleted

## 2011-05-02 NOTE — Telephone Encounter (Signed)
Called patient and left message on cell and home phone, he needs to call clinic and reschedule his lab visit this week, and if he can't make it, the office visit will need to be rescheduled also. Wendall Mola CMA

## 2011-05-12 ENCOUNTER — Ambulatory Visit: Payer: PRIVATE HEALTH INSURANCE | Admitting: Internal Medicine

## 2011-05-12 ENCOUNTER — Other Ambulatory Visit: Payer: PRIVATE HEALTH INSURANCE

## 2011-06-01 ENCOUNTER — Other Ambulatory Visit: Payer: PRIVATE HEALTH INSURANCE

## 2011-06-01 DIAGNOSIS — B20 Human immunodeficiency virus [HIV] disease: Secondary | ICD-10-CM

## 2011-06-01 LAB — COMPREHENSIVE METABOLIC PANEL
ALT: <8 U/L (ref 0–53)
Albumin: 4.4 g/dL (ref 3.5–5.2)
CO2: 27 mEq/L (ref 19–32)
Chloride: 102 mEq/L (ref 96–112)
Glucose, Bld: 83 mg/dL (ref 70–99)
Potassium: 3.6 mEq/L (ref 3.5–5.3)
Sodium: 137 mEq/L (ref 135–145)
Total Protein: 6.9 g/dL (ref 6.0–8.3)

## 2011-06-02 LAB — CBC WITH DIFFERENTIAL/PLATELET
Basophils Relative: 0 % (ref 0–1)
Eosinophils Absolute: 0.3 10*3/uL (ref 0.0–0.7)
HCT: 40.8 % (ref 39.0–52.0)
Hemoglobin: 14.6 g/dL (ref 13.0–17.0)
Lymphs Abs: 2.2 10*3/uL (ref 0.7–4.0)
MCH: 30.8 pg (ref 26.0–34.0)
MCHC: 35.8 g/dL (ref 30.0–36.0)
MCV: 86.1 fL (ref 78.0–100.0)
Monocytes Absolute: 0.6 10*3/uL (ref 0.1–1.0)
Monocytes Relative: 10 % (ref 3–12)
RBC: 4.74 MIL/uL (ref 4.22–5.81)

## 2011-06-02 LAB — T-HELPER CELL (CD4) - (RCID CLINIC ONLY): CD4 T Cell Abs: 580 uL (ref 400–2700)

## 2011-06-06 LAB — HIV-1 RNA QUANT-NO REFLEX-BLD: HIV-1 RNA Quant, Log: <1.3 {Log} (ref <1.30)

## 2011-06-15 ENCOUNTER — Ambulatory Visit: Payer: PRIVATE HEALTH INSURANCE | Admitting: Internal Medicine

## 2011-06-15 ENCOUNTER — Telehealth: Payer: Self-pay | Admitting: *Deleted

## 2011-06-15 NOTE — Telephone Encounter (Signed)
Called patient to reschedule his appt.  No working phone number, will refer to [facility] for multiple no shows and cancellations. Wendall Mola CMA

## 2011-06-21 ENCOUNTER — Ambulatory Visit: Payer: PRIVATE HEALTH INSURANCE

## 2011-07-18 ENCOUNTER — Encounter: Payer: Self-pay | Admitting: Internal Medicine

## 2011-07-18 ENCOUNTER — Ambulatory Visit (INDEPENDENT_AMBULATORY_CARE_PROVIDER_SITE_OTHER): Payer: PRIVATE HEALTH INSURANCE | Admitting: Internal Medicine

## 2011-07-18 VITALS — BP 154/95 | HR 69 | Temp 98.0°F | Ht 71.0 in | Wt 131.0 lb

## 2011-07-18 DIAGNOSIS — Z113 Encounter for screening for infections with a predominantly sexual mode of transmission: Secondary | ICD-10-CM

## 2011-07-18 DIAGNOSIS — Z79899 Other long term (current) drug therapy: Secondary | ICD-10-CM

## 2011-07-18 DIAGNOSIS — Z23 Encounter for immunization: Secondary | ICD-10-CM

## 2011-07-18 DIAGNOSIS — B20 Human immunodeficiency virus [HIV] disease: Secondary | ICD-10-CM

## 2011-07-18 NOTE — Assessment & Plan Note (Signed)
He continues to do well and now has been undetectable for a year.  This likely is due to good compliance and he is pleased with the regimen. I did discuss that he can take this regimen for decades and should continue. He is also going to establish with a primary care physician for other monitoring including blood pressure which was noted to be elevated today. He knows to come back if he has any difficulty but otherwise he will be seen in 6 months. He knows to use condoms with all sexual activity.

## 2011-07-18 NOTE — Patient Instructions (Signed)
Get your flu shot in the Fall

## 2011-07-18 NOTE — Progress Notes (Signed)
  Subjective:    Patient ID: Hector Armstrong, male    DOB: 1976/01/02, 36 y.o.   MRN: [id number]  HPI hhe comes in here for followup visit. He has not been in clinic for one year. He has been on Atripla and does report excellent compliance. He denies any missed doses since last year. He is here with his wife who assures that he gets his medication. He did have his Pneumovax done in April of this year. Last year he was seen and was undetectable for the first time and is undetectable again today. His CD4 count is over 500. He has no complaints. He is pleased with his regimen and had initially noted some vivid and bad dreams but this has since worn off.   Review of Systems  Constitutional: Negative for fever, chills, appetite change and unexpected weight change.  HENT: Negative for sore throat and trouble swallowing.   Respiratory: Negative for cough and shortness of breath.   Cardiovascular: Negative for chest pain, palpitations and leg swelling.  Gastrointestinal: Negative for nausea, abdominal pain and diarrhea.  Musculoskeletal: Negative for myalgias, joint swelling and arthralgias.  Skin: Negative for rash.  Neurological: Negative for dizziness, weakness and headaches.  Hematological: Negative for adenopathy.  Psychiatric/Behavioral: Negative for dysphoric mood. The patient is not nervous/anxious.        Objective:   Physical Exam  Constitutional: He appears well-developed and well-nourished. No distress.  HENT:  Mouth/Throat: Oropharynx is clear and moist. No oropharyngeal exudate.  Cardiovascular: Normal rate, regular rhythm and normal heart sounds.  Exam reveals no gallop and no friction rub.   No murmur heard. Pulmonary/Chest: Effort normal and breath sounds normal. No respiratory distress. He has no wheezes. He has no rales.  Abdominal: Soft. Bowel sounds are normal. He exhibits no distension. There is no tenderness. There is no rebound.  Lymphadenopathy:    He has no cervical  adenopathy.  Skin: Skin is warm and dry. No rash noted. No erythema.          Assessment & Plan:

## 2011-08-21 ENCOUNTER — Other Ambulatory Visit: Payer: Self-pay | Admitting: Licensed Clinical Social Worker

## 2011-08-21 DIAGNOSIS — B20 Human immunodeficiency virus [HIV] disease: Secondary | ICD-10-CM

## 2011-08-21 MED ORDER — EFAVIRENZ-EMTRICITAB-TENOFOVIR 600-200-300 MG PO TABS: 1.0000 | ORAL_TABLET | Freq: Every day | ORAL | Status: DC

## 2011-08-24 ENCOUNTER — Telehealth: Payer: Self-pay | Admitting: Internal Medicine

## 2011-08-24 NOTE — Telephone Encounter (Signed)
Faxed Mr. Matassa application to Atripla Patient Assistance today

## 2011-08-28 ENCOUNTER — Telehealth: Payer: Self-pay | Admitting: Internal Medicine

## 2011-08-28 NOTE — Telephone Encounter (Signed)
Called to check status with [employer] Patient Assistance.  The application should be processed today.  Will check back tomorrow on status.

## 2011-08-29 ENCOUNTER — Telehealth: Payer: Self-pay | Admitting: Internal Medicine

## 2011-08-29 NOTE — Telephone Encounter (Signed)
Received fax from [employer] Patient Assistance requesting more information.  I called [employer].  They wanted to know Hector Armstrong's status with ADAP.  I told them he has an appointment with Britta Mccreedy tomorrow.  I was told to call back tomorrow and get his information.

## 2011-08-30 ENCOUNTER — Ambulatory Visit: Payer: PRIVATE HEALTH INSURANCE

## 2011-08-30 ENCOUNTER — Telehealth: Payer: Self-pay | Admitting: Internal Medicine

## 2011-08-30 NOTE — Telephone Encounter (Signed)
Called Hector Armstrong and told her [name] has been approved for temp assistance for his Atripla.  They should be receiving his pharmacy card within the next 3-5 days.

## 2011-10-19 ENCOUNTER — Other Ambulatory Visit: Payer: Self-pay | Admitting: Licensed Clinical Social Worker

## 2011-10-30 ENCOUNTER — Telehealth: Payer: Self-pay | Admitting: Internal Medicine

## 2011-10-30 NOTE — Telephone Encounter (Signed)
Received fax from [employer] Patient Assistance.  Hector Armstrong has been approved for a 90 day supply of Atripla while waiting approval by ADAP.

## 2011-11-27 ENCOUNTER — Ambulatory Visit (INDEPENDENT_AMBULATORY_CARE_PROVIDER_SITE_OTHER): Payer: PRIVATE HEALTH INSURANCE

## 2011-11-27 DIAGNOSIS — Z23 Encounter for immunization: Secondary | ICD-10-CM

## 2011-11-30 ENCOUNTER — Telehealth: Payer: Self-pay | Admitting: Internal Medicine

## 2011-11-30 NOTE — Telephone Encounter (Signed)
Received a letter from [employer] stating Hector Armstrong has been removed from the program.  I called and spoke with his wife.  She said after Jan. 1 he will be covered on her insurance and that he has enough medication to get him through until then.

## 2012-01-01 ENCOUNTER — Other Ambulatory Visit: Payer: PRIVATE HEALTH INSURANCE

## 2012-01-15 ENCOUNTER — Ambulatory Visit: Payer: PRIVATE HEALTH INSURANCE | Admitting: Internal Medicine

## 2012-01-23 ENCOUNTER — Ambulatory Visit: Payer: Self-pay | Admitting: Internal Medicine

## 2012-02-15 ENCOUNTER — Other Ambulatory Visit (INDEPENDENT_AMBULATORY_CARE_PROVIDER_SITE_OTHER): Payer: BC Managed Care – PPO

## 2012-02-15 DIAGNOSIS — Z79899 Other long term (current) drug therapy: Secondary | ICD-10-CM

## 2012-02-15 DIAGNOSIS — B20 Human immunodeficiency virus [HIV] disease: Secondary | ICD-10-CM

## 2012-02-15 DIAGNOSIS — Z113 Encounter for screening for infections with a predominantly sexual mode of transmission: Secondary | ICD-10-CM

## 2012-02-15 LAB — CBC WITH DIFFERENTIAL/PLATELET
Basophils Relative: 1 % (ref 0–1)
Eosinophils Absolute: 0.2 10*3/uL (ref 0.0–0.7)
Eosinophils Relative: 6 % — ABNORMAL HIGH (ref 0–5)
Lymphs Abs: 1.9 10*3/uL (ref 0.7–4.0)
MCH: 29.6 pg (ref 26.0–34.0)
MCHC: 34.9 g/dL (ref 30.0–36.0)
MCV: 84.8 fL (ref 78.0–100.0)
Platelets: 268 10*3/uL (ref 150–400)
RBC: 4.87 MIL/uL (ref 4.22–5.81)
RDW: 13.6 % (ref 11.5–15.5)

## 2012-02-15 LAB — COMPLETE METABOLIC PANEL WITH GFR
ALT: 8 U/L (ref 0–53)
CO2: 26 mEq/L (ref 19–32)
Creat: 1.15 mg/dL (ref 0.50–1.35)
GFR, Est African American: >89 mL/min
GFR, Est Non African American: 81 mL/min
Total Bilirubin: 0.5 mg/dL (ref 0.3–1.2)

## 2012-02-15 LAB — LIPID PANEL
Cholesterol: 152 mg/dL (ref 0–200)
Triglycerides: 49 mg/dL (ref <150)
VLDL: 10 mg/dL (ref 0–40)

## 2012-02-15 LAB — RPR TITER: RPR Titer: 1:1 {titer}

## 2012-02-16 LAB — T.PALLIDUM AB, TOTAL: T pallidum Antibodies (TP-PA): >8 S/CO — ABNORMAL HIGH (ref <0.90)

## 2012-02-29 ENCOUNTER — Ambulatory Visit: Payer: Self-pay | Admitting: Internal Medicine

## 2012-04-18 ENCOUNTER — Other Ambulatory Visit: Payer: Self-pay | Admitting: Licensed Clinical Social Worker

## 2012-04-18 DIAGNOSIS — B20 Human immunodeficiency virus [HIV] disease: Secondary | ICD-10-CM

## 2012-04-18 MED ORDER — EFAVIRENZ-EMTRICITAB-TENOFOVIR 600-200-300 MG PO TABS: 1.0000 | ORAL_TABLET | Freq: Every day | ORAL | Status: DC

## 2012-04-23 ENCOUNTER — Encounter: Payer: Self-pay | Admitting: *Deleted

## 2012-05-09 ENCOUNTER — Other Ambulatory Visit: Payer: Self-pay | Admitting: Licensed Clinical Social Worker

## 2012-05-09 DIAGNOSIS — B20 Human immunodeficiency virus [HIV] disease: Secondary | ICD-10-CM

## 2012-05-09 MED ORDER — EFAVIRENZ-EMTRICITAB-TENOFOVIR 600-200-300 MG PO TABS: 1.0000 | ORAL_TABLET | Freq: Every day | ORAL | Status: DC

## 2012-09-13 ENCOUNTER — Telehealth: Payer: Self-pay | Admitting: *Deleted

## 2012-09-13 DIAGNOSIS — B20 Human immunodeficiency virus [HIV] disease: Secondary | ICD-10-CM

## 2012-09-13 MED ORDER — EFAVIRENZ-EMTRICITAB-TENOFOVIR 600-200-300 MG PO TABS: 1.0000 | ORAL_TABLET | Freq: Every day | ORAL | Status: DC

## 2012-09-13 NOTE — Telephone Encounter (Signed)
Pt has not been seen by Dr. Luciana Axe in over a year.  Requesting Atripla refill.  RN explained that the pt needs to be seen at least every 6 months by Dr. Luciana Axe to continue to receive the prescription medication.  HIV medications need monitoring for side effects and effects on a regular basis.  Wife verbalized understanding.  RN advised that the pt needs to make and keep lab and MD appointments within the next 30 days to continue to receive medication refills.  Lab and MD appointments were made for the pt.  RN explained that a 30-day supply of Atripla would be sent to the pharmacy with no refills.

## 2012-09-19 ENCOUNTER — Other Ambulatory Visit: Payer: BC Managed Care – PPO

## 2012-09-19 DIAGNOSIS — Z79899 Other long term (current) drug therapy: Secondary | ICD-10-CM

## 2012-09-19 DIAGNOSIS — Z113 Encounter for screening for infections with a predominantly sexual mode of transmission: Secondary | ICD-10-CM

## 2012-09-19 DIAGNOSIS — B20 Human immunodeficiency virus [HIV] disease: Secondary | ICD-10-CM

## 2012-09-19 LAB — CBC WITH DIFFERENTIAL/PLATELET
Basophils Absolute: 0 10*3/uL (ref 0.0–0.1)
Basophils Relative: 1 % (ref 0–1)
Eosinophils Absolute: 0.2 10*3/uL (ref 0.0–0.7)
Eosinophils Relative: 6 % — ABNORMAL HIGH (ref 0–5)
HCT: 40.4 % (ref 39.0–52.0)
Hemoglobin: 14.2 g/dL (ref 13.0–17.0)
MCH: 29.3 pg (ref 26.0–34.0)
MCHC: 35.1 g/dL (ref 30.0–36.0)
MCV: 83.5 fL (ref 78.0–100.0)
Monocytes Absolute: 0.4 10*3/uL (ref 0.1–1.0)
Monocytes Relative: 11 % (ref 3–12)
RDW: 14.3 % (ref 11.5–15.5)

## 2012-09-19 LAB — COMPREHENSIVE METABOLIC PANEL
ALT: 9 U/L (ref 0–53)
AST: 14 U/L (ref 0–37)
Albumin: 4.4 g/dL (ref 3.5–5.2)
Alkaline Phosphatase: 54 U/L (ref 39–117)
BUN: 15 mg/dL (ref 6–23)
Calcium: 8.9 mg/dL (ref 8.4–10.5)
Chloride: 104 mEq/L (ref 96–112)
Potassium: 3.9 mEq/L (ref 3.5–5.3)
Sodium: 139 mEq/L (ref 135–145)
Total Protein: 7 g/dL (ref 6.0–8.3)

## 2012-09-19 LAB — LIPID PANEL
LDL Cholesterol: 65 mg/dL (ref 0–99)
Triglycerides: 46 mg/dL (ref <150)
VLDL: 9 mg/dL (ref 0–40)

## 2012-09-20 LAB — HIV-1 RNA QUANT-NO REFLEX-BLD: HIV-1 RNA Quant, Log: <1.3 {Log} (ref <1.30)

## 2012-09-20 LAB — T.PALLIDUM AB, TOTAL: T pallidum Antibodies (TP-PA): >8 S/CO — ABNORMAL HIGH (ref <0.90)

## 2012-09-20 LAB — T-HELPER CELL (CD4) - (RCID CLINIC ONLY): CD4 T Cell Abs: 500 /uL (ref 400–2700)

## 2012-09-20 LAB — RPR TITER: RPR Titer: 1:1 {titer}

## 2012-10-09 ENCOUNTER — Telehealth: Payer: Self-pay | Admitting: *Deleted

## 2012-10-09 NOTE — Telephone Encounter (Signed)
Hector Armstrong came by and picked up a co-pay card for his Atripla.

## 2012-10-10 ENCOUNTER — Ambulatory Visit (INDEPENDENT_AMBULATORY_CARE_PROVIDER_SITE_OTHER): Payer: BC Managed Care – PPO | Admitting: Internal Medicine

## 2012-10-10 ENCOUNTER — Other Ambulatory Visit: Payer: Self-pay | Admitting: *Deleted

## 2012-10-10 ENCOUNTER — Encounter: Payer: Self-pay | Admitting: Internal Medicine

## 2012-10-10 VITALS — BP 148/103 | HR 90 | Temp 98.6°F | Ht 73.0 in | Wt 131.0 lb

## 2012-10-10 DIAGNOSIS — K589 Irritable bowel syndrome without diarrhea: Secondary | ICD-10-CM

## 2012-10-10 DIAGNOSIS — Z23 Encounter for immunization: Secondary | ICD-10-CM

## 2012-10-10 DIAGNOSIS — B20 Human immunodeficiency virus [HIV] disease: Secondary | ICD-10-CM

## 2012-10-10 MED ORDER — DICYCLOMINE HCL 10 MG PO CAPS: 20.0000 mg | ORAL_CAPSULE | Freq: Three times a day (TID) | ORAL | Status: DC

## 2012-10-10 MED ORDER — EFAVIRENZ-EMTRICITAB-TENOFOVIR 600-200-300 MG PO TABS: 1.0000 | ORAL_TABLET | Freq: Every day | ORAL | Status: DC

## 2012-10-10 NOTE — Progress Notes (Signed)
  Subjective:    Patient ID: Hector Armstrong, male    DOB: [birth year], 37 y.o.   MRN: [id number]  HPI He comes in for followup of his HIV. He continues on Atripla and denies any missed doses. He continues to have sporadic followup.  His viral load though remains undetectable and a CD4 count of 500. He complains of some abdominal bloating with an increased number of stools, up to 4 or 5 a day. He is never had this before and started about 2 weeks ago. It also equates with the recent addition of 2 twins babies that they adopted. He does endorse some increased stressors in his life with that and other children in the house. No weight loss, no nausea, no vomiting. No blood in his stool. He feels somewhat distressed by this increased stool output.   Review of Systems  Constitutional: Negative for fatigue.  HENT: Negative for sore throat and trouble swallowing.   Eyes: Negative for visual disturbance.  Respiratory: Negative for cough and shortness of breath.   Cardiovascular: Negative for chest pain.  Gastrointestinal: Positive for abdominal distention. Negative for nausea, vomiting and diarrhea.  Musculoskeletal: Negative for myalgias and arthralgias.  Skin: Negative for rash.  Neurological: Negative for dizziness, light-headedness and headaches.  Hematological: Negative for adenopathy.  Psychiatric/Behavioral: Positive for dysphoric mood.       Objective:   Physical Exam  Constitutional: He appears well-developed and well-nourished. No distress.  obese  HENT:  Mouth/Throat: No oropharyngeal exudate.  Eyes: No scleral icterus.  Cardiovascular: Normal rate and regular rhythm.   Murmur heard. Unchanged murmur  Pulmonary/Chest: Effort normal. No respiratory distress. He has no wheezes.  Lymphadenopathy:    He has no cervical adenopathy.  Neurological: He is alert.  Skin: No rash noted.  Psychiatric: He has a normal mood and affect. His behavior is normal.          Assessment &  Plan:

## 2012-10-10 NOTE — Assessment & Plan Note (Signed)
Suspect his history is compatible with irritable bowel syndrome. I did discuss the etiology including stress. With the bloating and stools are not loose, but increased in frequency, I will try Bentyl.  If he does not have any improvement or it worsens, he will call in we will refer him to gastroenterology.

## 2012-10-10 NOTE — Assessment & Plan Note (Addendum)
He is doing well with his current regimen despite his sporadic followup. He will come back in 6 months. Hepatitis A #2 today

## 2012-10-10 NOTE — Addendum Note (Signed)
Addended by: Andree Coss on: 10/10/2012 11:18 AM   Modules accepted: Orders

## 2012-10-22 ENCOUNTER — Other Ambulatory Visit: Payer: Self-pay | Admitting: *Deleted

## 2012-10-22 DIAGNOSIS — K589 Irritable bowel syndrome without diarrhea: Secondary | ICD-10-CM

## 2012-10-22 DIAGNOSIS — B20 Human immunodeficiency virus [HIV] disease: Secondary | ICD-10-CM

## 2012-10-22 MED ORDER — EFAVIRENZ-EMTRICITAB-TENOFOVIR 600-200-300 MG PO TABS: 1.0000 | ORAL_TABLET | Freq: Every day | ORAL | Status: DC

## 2012-11-21 ENCOUNTER — Other Ambulatory Visit: Payer: Self-pay

## 2012-12-19 ENCOUNTER — Telehealth: Payer: Self-pay | Admitting: *Deleted

## 2012-12-19 NOTE — Telephone Encounter (Signed)
Pt's to have oral surgery today, surgeon would like last labs faxed to [phone].  Done. Andree Coss, RN

## 2013-10-29 ENCOUNTER — Other Ambulatory Visit (INDEPENDENT_AMBULATORY_CARE_PROVIDER_SITE_OTHER): Payer: Medicaid Other

## 2013-10-29 DIAGNOSIS — Z79899 Other long term (current) drug therapy: Secondary | ICD-10-CM

## 2013-10-29 DIAGNOSIS — B20 Human immunodeficiency virus [HIV] disease: Secondary | ICD-10-CM

## 2013-10-29 DIAGNOSIS — Z113 Encounter for screening for infections with a predominantly sexual mode of transmission: Secondary | ICD-10-CM

## 2013-10-29 LAB — CBC WITH DIFFERENTIAL/PLATELET
Basophils Absolute: 0 10*3/uL (ref 0.0–0.1)
Basophils Relative: 1 % (ref 0–1)
Eosinophils Absolute: 0.2 10*3/uL (ref 0.0–0.7)
Eosinophils Relative: 6 % — ABNORMAL HIGH (ref 0–5)
HEMATOCRIT: 39.7 % (ref 39.0–52.0)
HEMOGLOBIN: 14.2 g/dL (ref 13.0–17.0)
LYMPHS ABS: 1.9 10*3/uL (ref 0.7–4.0)
LYMPHS PCT: 46 % (ref 12–46)
MCH: 30.3 pg (ref 26.0–34.0)
MCHC: 35.8 g/dL (ref 30.0–36.0)
MCV: 84.8 fL (ref 78.0–100.0)
MONO ABS: 0.5 10*3/uL (ref 0.1–1.0)
MONOS PCT: 12 % (ref 3–12)
NEUTROS ABS: 1.4 10*3/uL — AB (ref 1.7–7.7)
Neutrophils Relative %: 35 % — ABNORMAL LOW (ref 43–77)
Platelets: 294 10*3/uL (ref 150–400)
RBC: 4.68 MIL/uL (ref 4.22–5.81)
RDW: 13.6 % (ref 11.5–15.5)
WBC: 4.1 10*3/uL (ref 4.0–10.5)

## 2013-10-29 LAB — COMPREHENSIVE METABOLIC PANEL
ALT: 12 U/L (ref 0–53)
AST: 17 U/L (ref 0–37)
Albumin: 4.3 g/dL (ref 3.5–5.2)
Alkaline Phosphatase: 63 U/L (ref 39–117)
BILIRUBIN TOTAL: 0.4 mg/dL (ref 0.2–1.2)
BUN: 11 mg/dL (ref 6–23)
CHLORIDE: 106 meq/L (ref 96–112)
CO2: 27 mEq/L (ref 19–32)
CREATININE: 1.07 mg/dL (ref 0.50–1.35)
Calcium: 9.1 mg/dL (ref 8.4–10.5)
Glucose, Bld: 92 mg/dL (ref 70–99)
Potassium: 3.8 mEq/L (ref 3.5–5.3)
Sodium: 140 mEq/L (ref 135–145)
Total Protein: 7 g/dL (ref 6.0–8.3)

## 2013-10-29 LAB — LIPID PANEL
CHOL/HDL RATIO: 2.3 ratio
CHOLESTEROL: 121 mg/dL (ref 0–200)
HDL: 52 mg/dL (ref >39)
LDL Cholesterol: 57 mg/dL (ref 0–99)
Triglycerides: 62 mg/dL (ref <150)
VLDL: 12 mg/dL (ref 0–40)

## 2013-10-30 LAB — HIV-1 RNA QUANT-NO REFLEX-BLD
HIV 1 RNA Quant: <20 copies/mL (ref <20)
HIV-1 RNA Quant, Log: <1.3 {Log} (ref <1.30)

## 2013-10-30 LAB — RPR

## 2013-10-30 LAB — T-HELPER CELL (CD4) - (RCID CLINIC ONLY)
CD4 % Helper T Cell: 29 % — ABNORMAL LOW (ref 33–55)
CD4 T Cell Abs: 540 /uL (ref 400–2700)

## 2013-11-11 ENCOUNTER — Other Ambulatory Visit: Payer: Self-pay | Admitting: *Deleted

## 2013-11-11 ENCOUNTER — Ambulatory Visit (INDEPENDENT_AMBULATORY_CARE_PROVIDER_SITE_OTHER): Payer: Medicaid Other | Admitting: Internal Medicine

## 2013-11-11 ENCOUNTER — Encounter: Payer: Self-pay | Admitting: Internal Medicine

## 2013-11-11 VITALS — BP 154/88 | HR 61 | Temp 97.6°F | Wt 131.0 lb

## 2013-11-11 DIAGNOSIS — Z23 Encounter for immunization: Secondary | ICD-10-CM

## 2013-11-11 DIAGNOSIS — B20 Human immunodeficiency virus [HIV] disease: Secondary | ICD-10-CM

## 2013-11-11 MED ORDER — EFAVIRENZ-EMTRICITAB-TENOFOVIR 600-200-300 MG PO TABS
1.0000 | ORAL_TABLET | Freq: Every day | ORAL | Status: DC
Start: 2013-11-11 — End: 2014-11-23

## 2013-11-11 NOTE — Assessment & Plan Note (Signed)
Doing well, no issues.  RTC 6 months.  

## 2013-11-11 NOTE — Progress Notes (Signed)
  Subjective:    Patient ID: Hector Armstrong, male    DOB: 1975/10/17, 38 y.o.   MRN: [id number]  HPI He comes in for followup of his HIV. He continues on Atripla and denies any missed doses. He continues to have sporadic followup.  His viral load though remains undetectable and a CD4 count of 500.   He does endorse some increased stressors in his life with that and other children in the house, with work. No weight loss, no nausea, no vomiting.   Review of Systems  Constitutional: Negative for fatigue.  HENT: Negative for sore throat and trouble swallowing.   Respiratory: Negative for cough and shortness of breath.   Cardiovascular: Negative for chest pain.  Gastrointestinal: Negative for nausea, vomiting, diarrhea and abdominal distention.  Musculoskeletal: Negative for arthralgias and myalgias.  Skin: Negative for rash.  Neurological: Negative for dizziness, light-headedness and headaches.  Hematological: Negative for adenopathy.  Psychiatric/Behavioral: Negative for dysphoric mood.       Objective:   Physical Exam  Constitutional: He appears well-developed and well-nourished. No distress.  HENT:  Mouth/Throat: No oropharyngeal exudate.  Eyes: No scleral icterus.  Cardiovascular: Normal rate and regular rhythm.   Murmur heard. Unchanged murmur  Pulmonary/Chest: Effort normal. No respiratory distress. He has no wheezes.  Lymphadenopathy:    He has no cervical adenopathy.  Neurological: He is alert.  Skin: No rash noted.  Psychiatric: He has a normal mood and affect. His behavior is normal.          Assessment & Plan:

## 2013-12-04 ENCOUNTER — Other Ambulatory Visit: Payer: Self-pay | Admitting: *Deleted

## 2013-12-23 ENCOUNTER — Emergency Department (HOSPITAL_COMMUNITY): Payer: Medicaid Other

## 2013-12-23 ENCOUNTER — Encounter (HOSPITAL_COMMUNITY): Payer: Self-pay | Admitting: *Deleted

## 2013-12-23 ENCOUNTER — Emergency Department (HOSPITAL_COMMUNITY)
Admission: EM | Admit: 2013-12-23 | Discharge: 2013-12-23 | Disposition: A | Discharge status: Home / Self Care | Payer: Medicaid Other | Attending: Emergency Medicine | Admitting: Emergency Medicine

## 2013-12-23 DIAGNOSIS — R079 Chest pain, unspecified: Secondary | ICD-10-CM | POA: Diagnosis present

## 2013-12-23 DIAGNOSIS — Z79899 Other long term (current) drug therapy: Secondary | ICD-10-CM | POA: Diagnosis not present

## 2013-12-23 DIAGNOSIS — R0989 Other specified symptoms and signs involving the circulatory and respiratory systems: Secondary | ICD-10-CM

## 2013-12-23 DIAGNOSIS — R0789 Other chest pain: Secondary | ICD-10-CM

## 2013-12-23 LAB — BASIC METABOLIC PANEL
ANION GAP: 12 (ref 5–15)
BUN: 15 mg/dL (ref 6–23)
CALCIUM: 9.4 mg/dL (ref 8.4–10.5)
CHLORIDE: 104 meq/L (ref 96–112)
CO2: 25 meq/L (ref 19–32)
Creatinine, Ser: 1.09 mg/dL (ref 0.50–1.35)
GFR calc Af Amer: >90 mL/min (ref >90)
GFR calc non Af Amer: 85 mL/min — ABNORMAL LOW (ref >90)
GLUCOSE: 102 mg/dL — AB (ref 70–99)
Potassium: 3.6 mEq/L — ABNORMAL LOW (ref 3.7–5.3)
Sodium: 141 mEq/L (ref 137–147)

## 2013-12-23 LAB — CBC WITH DIFFERENTIAL/PLATELET
BASOS ABS: 0 10*3/uL (ref 0.0–0.1)
Basophils Relative: 1 % (ref 0–1)
Eosinophils Absolute: 0.4 10*3/uL (ref 0.0–0.7)
Eosinophils Relative: 6 % — ABNORMAL HIGH (ref 0–5)
HEMATOCRIT: 39.7 % (ref 39.0–52.0)
HEMOGLOBIN: 13.8 g/dL (ref 13.0–17.0)
LYMPHS ABS: 2.5 10*3/uL (ref 0.7–4.0)
LYMPHS PCT: 38 % (ref 12–46)
MCH: 31.1 pg (ref 26.0–34.0)
MCHC: 34.8 g/dL (ref 30.0–36.0)
MCV: 89.4 fL (ref 78.0–100.0)
MONOS PCT: 9 % (ref 3–12)
Monocytes Absolute: 0.6 10*3/uL (ref 0.1–1.0)
NEUTROS ABS: 3.1 10*3/uL (ref 1.7–7.7)
Neutrophils Relative %: 46 % (ref 43–77)
Platelets: 259 10*3/uL (ref 150–400)
RBC: 4.44 MIL/uL (ref 4.22–5.81)
RDW: 12.8 % (ref 11.5–15.5)
WBC: 6.6 10*3/uL (ref 4.0–10.5)

## 2013-12-23 LAB — TROPONIN I: Troponin I: <0.3 ng/mL (ref <0.30)

## 2013-12-23 LAB — D-DIMER, QUANTITATIVE: D-Dimer, Quant: <0.27 ug/mL-FEU (ref 0.00–0.48)

## 2013-12-23 MED ORDER — GUAIFENESIN 100 MG/5ML PO LIQD: 100.0000 mg | ORAL | Status: DC | PRN

## 2013-12-23 NOTE — Discharge Instructions (Signed)

## 2013-12-23 NOTE — ED Notes (Signed)
Pt c/o right sided chest pain x several days. Described as an "electrocuting" pain through right chest and under arm. Pt reports feeling congested since Thanksgiving; denies shortness of breath or cough. Lung sounds clear. Pt denies pain on assessment.

## 2013-12-23 NOTE — ED Provider Notes (Signed)
CSN: [id number]     Arrival date & time 12/23/13  0017 History   This chart was scribed for Loren Raceravid Siearra Amberg, MD by Ronney LionSuzanne Le, ED Scribe. This patient was seen in room B18C/B18C and the patient's care was started at 1:58 AM.    Chief Complaint  Patient presents with  . Chest Pain  . Chest Congestion    Patient is a 38 y.o. male presenting with chest pain. The history is provided by the patient. No language interpreter was used.  Chest Pain Associated symptoms: no abdominal pain, no back pain, no cough, no dizziness, no fever, no headache, no nausea, no numbness, no shortness of breath, not vomiting and no weakness      HPI Comments: Hector Armstrong is a 38 y.o. male who presents to the Emergency Department complaining of intemittent episodes of right chest pain through right under arm lasting a few seconds at a time that began several days ago. He describes it as feeling like an electric shock. He complains of chest congestion that "feels like you're about to get a cold" that began 1.5 weeks ago. Patient states that stress at work triggers his pain; his last episode of CP was earlier today, and he states he is not currently in pain. He denies cough, fever, chills, pedal edema, rhinorrhea, sore throat, recent travel, recent surgery, and smoking.     History reviewed. No pertinent past medical history. History reviewed. No pertinent past surgical history. No family history on file. History  Substance Use Topics  . Smoking status: Never Smoker   . Smokeless tobacco: Never Used  . Alcohol Use: No    Review of Systems  Constitutional: Negative for fever and chills.  HENT: Positive for congestion.   Respiratory: Negative for cough, chest tightness, shortness of breath and wheezing.   Cardiovascular: Positive for chest pain.  Gastrointestinal: Negative for nausea, vomiting, abdominal pain, diarrhea and constipation.  Musculoskeletal: Negative for myalgias, back pain, neck pain and neck  stiffness.  Skin: Negative for rash and wound.  Neurological: Negative for dizziness, weakness, light-headedness, numbness and headaches.  All other systems reviewed and are negative.     Allergies  Review of patient's allergies indicates no known allergies.  Home Medications   Prior to Admission medications   Medication Sig Start Date End Date Taking? Authorizing Provider  efavirenz-emtricitabine-tenofovir (ATRIPLA) 600-200-300 MG per tablet Take 1 tablet by mouth at bedtime. 11/11/13  Yes Gardiner Barefootobert W Comer, MD  guaiFENesin (ROBITUSSIN) 100 MG/5ML liquid Take 5-10 mLs (100-200 mg total) by mouth every 4 (four) hours as needed for cough or congestion. 12/23/13   Loren Raceravid Korby Ratay, MD   BP 140/90 mmHg  Pulse 62  Temp(Src) 98.4 F (36.9 C) (Oral)  Resp 19  Ht 6\' 1"  (1.854 m)  Wt 138 lb (62.596 kg)  BMI 18.21 kg/m2  SpO2 98% Physical Exam  Constitutional: He is oriented to person, place, and time. He appears well-developed and well-nourished. No distress.  HENT:  Head: Normocephalic and atraumatic.  Mouth/Throat: Oropharynx is clear and moist.  Eyes: EOM are normal. Pupils are equal, round, and reactive to light.  Neck: Normal range of motion. Neck supple.  Cardiovascular: Normal rate and regular rhythm.   Pulmonary/Chest: Effort normal and breath sounds normal. No respiratory distress. He has no wheezes. He has no rales. He exhibits no tenderness.  Abdominal: Soft. Bowel sounds are normal. He exhibits no distension and no mass. There is no tenderness. There is no rebound and no guarding.  Musculoskeletal:  Normal range of motion. He exhibits no edema or tenderness.  No calf swelling or tenderness.  Neurological: He is alert and oriented to person, place, and time.  Skin: Skin is warm and dry. No rash noted. No erythema.  Psychiatric: He has a normal mood and affect. His behavior is normal.  Nursing note and vitals reviewed.   ED Course  Procedures (including critical care  time)  DIAGNOSTIC STUDIES: Oxygen Saturation is 99% on room air, normal by my interpretation.    COORDINATION OF CARE: 2:02 AM - Discussed treatment plan with pt at bedside which includes tests and pt agreed to plan.   Labs Review Labs Reviewed  CBC WITH DIFFERENTIAL - Abnormal; Notable for the following:    Eosinophils Relative 6 (*)    All other components within normal limits  BASIC METABOLIC PANEL - Abnormal; Notable for the following:    Potassium 3.6 (*)    Glucose, Bld 102 (*)    GFR calc non Af Amer 85 (*)    All other components within normal limits  TROPONIN I  D-DIMER, QUANTITATIVE    Imaging Review Dg Chest 2 View  12/23/2013   CLINICAL DATA:  Cough and congestion for 2 days.  Chest pain.  EXAM: CHEST  2 VIEW  COMPARISON:  None.  FINDINGS: Mild hyperinflation. The heart size and mediastinal contours are within normal limits. Both lungs are clear. The visualized skeletal structures are unremarkable. Small calcified granuloma in the right mid lung.  IMPRESSION: No active cardiopulmonary disease.   Electronically Signed   By: Burman NievesWilliam  Stevens M.D.   On: 12/23/2013 00:48     EKG Interpretation None      MDM   Final diagnoses:  Atypical chest pain  Chest congestion    I personally performed the services described in this documentation, which was scribed in my presence. The recorded information has been reviewed and is accurate.  Patient remained symptom-free in the emergency department. Workup is negative including d-dimer. Advised to follow-up with his primary doctor. Return precautions given.  Loren Raceravid Melvin Whiteford, MD 12/24/13 [phone]

## 2013-12-23 NOTE — ED Notes (Signed)
Patient presents with c/o shocking pain to the right chest and up under his arm.  Has been congested since Thanksgiving.  Was seen at the Urgent Care and EKG done with minimal labs done.

## 2014-05-14 ENCOUNTER — Ambulatory Visit: Payer: Medicaid Other | Admitting: Internal Medicine

## 2014-10-09 ENCOUNTER — Ambulatory Visit (INDEPENDENT_AMBULATORY_CARE_PROVIDER_SITE_OTHER): Payer: Medicaid Other | Admitting: *Deleted

## 2014-10-09 DIAGNOSIS — Z23 Encounter for immunization: Secondary | ICD-10-CM | POA: Diagnosis present

## 2014-10-13 ENCOUNTER — Other Ambulatory Visit: Payer: Medicaid Other

## 2014-11-05 ENCOUNTER — Other Ambulatory Visit: Payer: Medicaid Other

## 2014-11-05 DIAGNOSIS — B20 Human immunodeficiency virus [HIV] disease: Secondary | ICD-10-CM

## 2014-11-06 LAB — HIV-1 RNA QUANT-NO REFLEX-BLD: HIV 1 RNA Quant: <20 copies/mL (ref <20)

## 2014-11-06 LAB — T-HELPER CELL (CD4) - (RCID CLINIC ONLY)
CD4 T CELL HELPER: 25 % — AB (ref 33–55)
CD4 T Cell Abs: 540 /uL (ref 400–2700)

## 2014-11-19 ENCOUNTER — Ambulatory Visit (INDEPENDENT_AMBULATORY_CARE_PROVIDER_SITE_OTHER): Payer: Medicaid Other | Admitting: Internal Medicine

## 2014-11-19 ENCOUNTER — Encounter: Payer: Self-pay | Admitting: Internal Medicine

## 2014-11-19 VITALS — BP 132/82 | HR 62 | Temp 98.2°F | Ht 73.0 in | Wt 138.2 lb

## 2014-11-19 DIAGNOSIS — Z113 Encounter for screening for infections with a predominantly sexual mode of transmission: Secondary | ICD-10-CM

## 2014-11-19 DIAGNOSIS — K588 Other irritable bowel syndrome: Secondary | ICD-10-CM

## 2014-11-19 DIAGNOSIS — B20 Human immunodeficiency virus [HIV] disease: Secondary | ICD-10-CM

## 2014-11-19 DIAGNOSIS — Z79899 Other long term (current) drug therapy: Secondary | ICD-10-CM | POA: Diagnosis not present

## 2014-11-19 NOTE — Assessment & Plan Note (Signed)
Stable, no worsening

## 2014-11-19 NOTE — Progress Notes (Signed)
  Subjective:    Patient ID: Hector Armstrong, male    DOB: Oct 31, 1975, 39 y.o.   MRN: [id number]  HPI He comes in for followup of his HIV.  He continues on Atripla and denies any missed doses. He continues to have sporadic followup.  His viral load though remains undetectable and a CD4 count of 540.   No weight loss, no nausea, no vomiting.  Had flu shot already.  No missed doses.    Review of Systems  Constitutional: Negative for fatigue.  HENT: Negative for sore throat and trouble swallowing.   Gastrointestinal: Negative for nausea and diarrhea.  Skin: Negative for rash.  Neurological: Negative for dizziness, light-headedness and headaches.  Psychiatric/Behavioral: Negative for sleep disturbance.       Objective:   Physical Exam  Constitutional: He appears well-developed and well-nourished. No distress.  HENT:  Mouth/Throat: No oropharyngeal exudate.  Eyes: No scleral icterus.  Cardiovascular: Normal rate and regular rhythm.   Murmur heard. Unchanged murmur  Pulmonary/Chest: Effort normal. No respiratory distress. He has no wheezes.  Lymphadenopathy:    He has no cervical adenopathy.  Skin: No rash noted.          Assessment & Plan:

## 2014-11-19 NOTE — Assessment & Plan Note (Signed)
Doing great and will continue with Atripla.  RTC 6 months.

## 2014-11-23 ENCOUNTER — Other Ambulatory Visit: Payer: Self-pay | Admitting: Internal Medicine

## 2015-05-06 ENCOUNTER — Other Ambulatory Visit: Payer: Medicaid Other

## 2015-05-06 DIAGNOSIS — B20 Human immunodeficiency virus [HIV] disease: Secondary | ICD-10-CM

## 2015-05-06 DIAGNOSIS — Z113 Encounter for screening for infections with a predominantly sexual mode of transmission: Secondary | ICD-10-CM

## 2015-05-06 DIAGNOSIS — Z79899 Other long term (current) drug therapy: Secondary | ICD-10-CM

## 2015-05-06 LAB — COMPLETE METABOLIC PANEL WITH GFR
ALT: 15 U/L (ref 9–46)
AST: 22 U/L (ref 10–40)
Albumin: 3.9 g/dL (ref 3.6–5.1)
Alkaline Phosphatase: 60 U/L (ref 40–115)
BILIRUBIN TOTAL: 0.3 mg/dL (ref 0.2–1.2)
BUN: 14 mg/dL (ref 7–25)
CO2: 25 mmol/L (ref 20–31)
Calcium: 8.2 mg/dL — ABNORMAL LOW (ref 8.6–10.3)
Chloride: 103 mmol/L (ref 98–110)
Creat: 0.99 mg/dL (ref 0.60–1.35)
GFR, Est Non African American: >89 mL/min (ref >=60)
GLUCOSE: 94 mg/dL (ref 65–99)
Potassium: 4.2 mmol/L (ref 3.5–5.3)
SODIUM: 137 mmol/L (ref 135–146)
TOTAL PROTEIN: 6.2 g/dL (ref 6.1–8.1)

## 2015-05-06 LAB — LIPID PANEL
Cholesterol: 135 mg/dL (ref 125–200)
HDL: 72 mg/dL (ref >=40)
LDL CALC: 55 mg/dL (ref <130)
TRIGLYCERIDES: 38 mg/dL (ref <150)
Total CHOL/HDL Ratio: 1.9 Ratio (ref <=5.0)
VLDL: 8 mg/dL (ref <30)

## 2015-05-07 LAB — HIV-1 RNA QUANT-NO REFLEX-BLD
HIV 1 RNA Quant: <20 copies/mL (ref <20)
HIV-1 RNA Quant, Log: <1.3 Log copies/mL (ref <1.30)

## 2015-05-07 LAB — T-HELPER CELL (CD4) - (RCID CLINIC ONLY)
CD4 % Helper T Cell: 30 % — ABNORMAL LOW (ref 33–55)
CD4 T Cell Abs: 560 /uL (ref 400–2700)

## 2015-05-08 LAB — RPR: RPR Ser Ql: REACTIVE — AB

## 2015-05-08 LAB — RPR TITER

## 2015-05-10 LAB — FLUORESCENT TREPONEMAL AB(FTA)-IGG-BLD: Fluorescent Treponemal ABS: REACTIVE — AB

## 2015-05-20 ENCOUNTER — Ambulatory Visit (INDEPENDENT_AMBULATORY_CARE_PROVIDER_SITE_OTHER): Payer: Medicaid Other | Admitting: Internal Medicine

## 2015-05-20 ENCOUNTER — Encounter: Payer: Self-pay | Admitting: Internal Medicine

## 2015-05-20 VITALS — BP 144/92 | HR 53 | Temp 97.4°F | Wt 137.0 lb

## 2015-05-20 DIAGNOSIS — B20 Human immunodeficiency virus [HIV] disease: Secondary | ICD-10-CM | POA: Diagnosis present

## 2015-05-20 DIAGNOSIS — G4700 Insomnia, unspecified: Secondary | ICD-10-CM | POA: Diagnosis not present

## 2015-05-20 NOTE — Assessment & Plan Note (Signed)
Sleeping well now, no complaints.

## 2015-05-20 NOTE — Assessment & Plan Note (Signed)
Doing great.  Continue Atripla since no issues and rtc 6 months.

## 2015-05-20 NOTE — Progress Notes (Signed)
  Subjective:    Patient ID: Hector Armstrong, male    DOB: 1975-08-21, 40 y.o.   MRN: [id number]  HPI He comes in for followup of his HIV.  He continues on Atripla and denies any missed doses. He continues to have good followup now.  His viral load though remains undetectable and a CD4 count of 560.   No weight loss, no nausea, no vomiting.  No missed doses.  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     Review of Systems  Constitutional: Negative for fatigue.  HENT: Negative for sore throat and trouble swallowing.   Gastrointestinal: Negative for nausea and diarrhea.  Skin: Negative for rash.  Neurological: Negative for dizziness, light-headedness and headaches.  Psychiatric/Behavioral: Negative for sleep disturbance.       Objective:   Physical Exam  Constitutional: He appears well-developed and well-nourished. No distress.  HENT:  Mouth/Throat: No oropharyngeal exudate.  Eyes: No scleral icterus.  Cardiovascular: Normal rate and regular rhythm.   Murmur heard. Unchanged murmur  Pulmonary/Chest: Effort normal. No respiratory distress. He has no wheezes.  Lymphadenopathy:    He has no cervical adenopathy.  Skin: No rash noted.          Assessment & Plan:

## 2015-06-13 ENCOUNTER — Encounter (HOSPITAL_COMMUNITY): Payer: Self-pay

## 2015-06-13 ENCOUNTER — Emergency Department (HOSPITAL_COMMUNITY)
Admission: EM | Admit: 2015-06-13 | Discharge: 2015-06-13 | Disposition: A | Discharge status: Home / Self Care | Payer: Medicaid Other | Attending: Emergency Medicine | Admitting: Emergency Medicine

## 2015-06-13 DIAGNOSIS — Y990 Civilian activity done for income or pay: Secondary | ICD-10-CM | POA: Diagnosis not present

## 2015-06-13 DIAGNOSIS — Z21 Asymptomatic human immunodeficiency virus [HIV] infection status: Secondary | ICD-10-CM | POA: Insufficient documentation

## 2015-06-13 DIAGNOSIS — M546 Pain in thoracic spine: Secondary | ICD-10-CM | POA: Insufficient documentation

## 2015-06-13 DIAGNOSIS — R1013 Epigastric pain: Secondary | ICD-10-CM | POA: Diagnosis not present

## 2015-06-13 DIAGNOSIS — Y9389 Activity, other specified: Secondary | ICD-10-CM | POA: Insufficient documentation

## 2015-06-13 DIAGNOSIS — X500XXA Overexertion from strenuous movement or load, initial encounter: Secondary | ICD-10-CM | POA: Insufficient documentation

## 2015-06-13 DIAGNOSIS — Y929 Unspecified place or not applicable: Secondary | ICD-10-CM | POA: Diagnosis not present

## 2015-06-13 DIAGNOSIS — Z79899 Other long term (current) drug therapy: Secondary | ICD-10-CM | POA: Insufficient documentation

## 2015-06-13 HISTORY — DX: Asymptomatic human immunodeficiency virus (hiv) infection status: Z21

## 2015-06-13 HISTORY — DX: Human immunodeficiency virus (HIV) disease: B20

## 2015-06-13 LAB — CBC WITH DIFFERENTIAL/PLATELET
BASOS ABS: 0 10*3/uL (ref 0.0–0.1)
Basophils Relative: 1 %
Eosinophils Absolute: 0.2 10*3/uL (ref 0.0–0.7)
Eosinophils Relative: 3 %
HEMATOCRIT: 41.7 % (ref 39.0–52.0)
Hemoglobin: 14.2 g/dL (ref 13.0–17.0)
LYMPHS PCT: 27 %
Lymphs Abs: 2 10*3/uL (ref 0.7–4.0)
MCH: 30.4 pg (ref 26.0–34.0)
MCHC: 34.1 g/dL (ref 30.0–36.0)
MCV: 89.3 fL (ref 78.0–100.0)
MONO ABS: 0.6 10*3/uL (ref 0.1–1.0)
MONOS PCT: 8 %
Neutro Abs: 4.5 10*3/uL (ref 1.7–7.7)
Neutrophils Relative %: 61 %
PLATELETS: 245 10*3/uL (ref 150–400)
RBC: 4.67 MIL/uL (ref 4.22–5.81)
RDW: 12.7 % (ref 11.5–15.5)
WBC: 7.4 10*3/uL (ref 4.0–10.5)

## 2015-06-13 LAB — COMPREHENSIVE METABOLIC PANEL
ALBUMIN: 4.3 g/dL (ref 3.5–5.0)
ALT: 19 U/L (ref 17–63)
AST: 27 U/L (ref 15–41)
Alkaline Phosphatase: 62 U/L (ref 38–126)
Anion gap: 8 (ref 5–15)
BUN: 18 mg/dL (ref 6–20)
CHLORIDE: 103 mmol/L (ref 101–111)
CO2: 25 mmol/L (ref 22–32)
Calcium: 9.1 mg/dL (ref 8.9–10.3)
Creatinine, Ser: 1.19 mg/dL (ref 0.61–1.24)
GFR calc Af Amer: >60 mL/min (ref >60)
GFR calc non Af Amer: >60 mL/min (ref >60)
Glucose, Bld: 102 mg/dL — ABNORMAL HIGH (ref 65–99)
POTASSIUM: 4.1 mmol/L (ref 3.5–5.1)
Sodium: 136 mmol/L (ref 135–145)
Total Bilirubin: 0.8 mg/dL (ref 0.3–1.2)
Total Protein: 7.1 g/dL (ref 6.5–8.1)

## 2015-06-13 LAB — LIPASE, BLOOD: Lipase: 43 U/L (ref 11–51)

## 2015-06-13 MED ORDER — OMEPRAZOLE 20 MG PO CPDR: 20.0000 mg | DELAYED_RELEASE_CAPSULE | Freq: Every day | ORAL | Status: DC

## 2015-06-13 MED ORDER — GI COCKTAIL ~~LOC~~
30.0000 mL | Freq: Once | ORAL | Status: AC
  Administered 2015-06-13: 30 mL via ORAL
  Filled 2015-06-13: qty 30

## 2015-06-13 MED ORDER — METHOCARBAMOL 500 MG PO TABS: 500.0000 mg | ORAL_TABLET | Freq: Two times a day (BID) | ORAL | Status: DC

## 2015-06-13 MED ORDER — PANTOPRAZOLE SODIUM 40 MG PO TBEC
40.0000 mg | DELAYED_RELEASE_TABLET | Freq: Once | ORAL | Status: AC
  Administered 2015-06-13: 40 mg via ORAL
  Filled 2015-06-13: qty 1

## 2015-06-13 NOTE — ED Provider Notes (Signed)
CSN: [id number]     Arrival date & time 06/13/15  1420 History   First MD Initiated Contact with Patient 06/13/15 1545     Chief Complaint  Patient presents with  . Abdominal Pain     (Consider location/radiation/quality/duration/timing/severity/associated sxs/prior Treatment) HPI Comments: Patient presents today with epigastric abdominal pain.  He reports that he has had a dull ache since yesterday.  Pain has been constant, but has improved at this time.  He states that the pain feels like indigestion.  He reports that he has had associated burping and gas.  He has not noticed that the pain worsens with food.  He denies any regular alcohol use or regular NSAID use.  He denies fever, chills, nausea, vomiting, chest pain, cough, SOB, or any other symptoms at this time. Patient also reports that he was having some upper back pain yesterday.  He describes this pain as a soreness.  He states that he took a OTC "muscle pill" for this, which helped.  He reports that the pain was worse with movement.  He states that he does a lot of heavy lifting at work. This pain was associated with some numbness and tingling in his arms when he would bend over.    Patient is a 40 y.o. male presenting with abdominal pain. The history is provided by the patient.  Abdominal Pain   Past Medical History  Diagnosis Date  . HIV (human immunodeficiency virus infection) (HCC)    History reviewed. No pertinent past surgical history. History reviewed. No pertinent family history. Social History  Substance Use Topics  . Smoking status: Never Smoker   . Smokeless tobacco: Never Used  . Alcohol Use: No    Review of Systems  Gastrointestinal: Positive for abdominal pain.  All other systems reviewed and are negative.     Allergies  Review of patient's allergies indicates no known allergies.  Home Medications   Prior to Admission medications   Medication Sig Start Date End Date Taking? Authorizing Provider   ATRIPLA 600-200-300 MG tablet TAKE 1 TABLET BY MOUTH AT BEDTIME 11/23/14   Gardiner Barefoot, MD  guaiFENesin (ROBITUSSIN) 100 MG/5ML liquid Take 5-10 mLs (100-200 mg total) by mouth every 4 (four) hours as needed for cough or congestion. Patient not taking: Reported on 05/20/2015 12/23/13   Loren Racer, MD   BP 144/94 mmHg  Pulse 77  Temp(Src) 97.6 F (36.4 C) (Oral)  Resp 16  Ht  (1.803 m)  Wt 61.916 kg  BMI 19.05 kg/m2  SpO2 97% Physical Exam  Constitutional: He appears well-developed and well-nourished. No distress.  HENT:  Head: Normocephalic and atraumatic.  Mouth/Throat: Oropharynx is clear and moist.  Neck: Normal range of motion. Neck supple.  Cardiovascular: Normal rate, regular rhythm and normal heart sounds.   Pulmonary/Chest: Effort normal and breath sounds normal.  Abdominal: Soft. Bowel sounds are normal. He exhibits no distension and no mass. There is no tenderness. There is no rebound and no guarding.  Musculoskeletal: Normal range of motion.       Thoracic back: He exhibits normal range of motion, no tenderness, no bony tenderness, no swelling, no edema and no deformity.       Lumbar back: He exhibits normal range of motion, no tenderness, no bony tenderness, no swelling, no edema and no deformity.  Tenderness to palpation of the thoracic and lumbar paraspinal muscles  Neurological: He is alert. He has normal strength. No sensory deficit. Gait normal.  Reflex Scores:  Brachioradialis reflexes are 2+ on the right side and 2+ on the left side.      Patellar reflexes are 2+ on the right side and 2+ on the left side. Skin: Skin is warm and dry. He is not diaphoretic.  Psychiatric: He has a normal mood and affect.  Nursing note and vitals reviewed.   ED Course  Procedures (including critical care time) Labs Review Labs Reviewed - No data to display  Imaging Review No results found. I have personally reviewed and evaluated these images and lab results as  part of my medical decision-making.   EKG Interpretation None     6:35 PM Reassessed patient.  He reports that his pain has resolved.  He states that he is having some muscle spasms in his back. MDM   Final diagnoses:  None   Patient presents today with epigastric abdominal pain that has been present since yesterday.  Labs unremarkable.  No tenderness to palpation on exam.  Pain improved after given Protonix and GI cocktail.  No nausea or vomiting.  Patient also presents with back pain that is worse with movement.  He reports lifting heavy objects.  Suspect that the pain is muscular.  No neurological deficits and normal neuro exam.  Patient can walk but states is painful.  No loss of bowel or bladder control.  No concern for cauda equina.  No fever, night sweats, weight loss, h/o cancer, IVDU.  Patient stable for discharge.  Return precautions given.       Santiago GladHeather Rashod Gougeon, PA-C 06/15/15 [id number]  Alvira MondayErin Schlossman, MD 06/16/15 1321

## 2015-06-13 NOTE — ED Notes (Signed)
Onset yesterday epigastric pain, pt thinks it is acid reflux, took reflux med with no relief.   Pt has been burping and having gas.  Pt had tingling in arms when bending over yesterday.

## 2015-08-24 ENCOUNTER — Telehealth: Payer: Self-pay | Admitting: *Deleted

## 2015-08-24 NOTE — Telephone Encounter (Signed)
Patient reports - "Intermittent sharp, shooting pains, helped somewhat by muscle relaxants, would appreciate recommendations for Dr. Luciana Axeomer."  Pharmacist, please see ED visit notes.

## 2015-08-26 ENCOUNTER — Encounter (HOSPITAL_COMMUNITY): Payer: Self-pay | Admitting: Emergency Medicine

## 2015-08-26 ENCOUNTER — Emergency Department (HOSPITAL_COMMUNITY)
Admission: EM | Admit: 2015-08-26 | Discharge: 2015-08-26 | Disposition: A | Discharge status: Home / Self Care | Payer: Medicaid Other | Attending: Emergency Medicine | Admitting: Emergency Medicine

## 2015-08-26 ENCOUNTER — Emergency Department (HOSPITAL_COMMUNITY): Payer: Medicaid Other

## 2015-08-26 DIAGNOSIS — K4090 Unilateral inguinal hernia, without obstruction or gangrene, not specified as recurrent: Secondary | ICD-10-CM | POA: Diagnosis not present

## 2015-08-26 DIAGNOSIS — M79672 Pain in left foot: Secondary | ICD-10-CM | POA: Insufficient documentation

## 2015-08-26 DIAGNOSIS — M79671 Pain in right foot: Secondary | ICD-10-CM | POA: Diagnosis not present

## 2015-08-26 DIAGNOSIS — R202 Paresthesia of skin: Secondary | ICD-10-CM

## 2015-08-26 DIAGNOSIS — R072 Precordial pain: Secondary | ICD-10-CM | POA: Diagnosis present

## 2015-08-26 DIAGNOSIS — R0789 Other chest pain: Secondary | ICD-10-CM

## 2015-08-26 LAB — I-STAT TROPONIN, ED: TROPONIN I, POC: 0 ng/mL (ref 0.00–0.08)

## 2015-08-26 LAB — BASIC METABOLIC PANEL
Anion gap: 8 (ref 5–15)
BUN: 15 mg/dL (ref 6–20)
CALCIUM: 9 mg/dL (ref 8.9–10.3)
CHLORIDE: 104 mmol/L (ref 101–111)
CO2: 26 mmol/L (ref 22–32)
CREATININE: 0.98 mg/dL (ref 0.61–1.24)
GFR calc non Af Amer: >60 mL/min (ref >60)
Glucose, Bld: 114 mg/dL — ABNORMAL HIGH (ref 65–99)
Potassium: 4 mmol/L (ref 3.5–5.1)
SODIUM: 138 mmol/L (ref 135–145)

## 2015-08-26 LAB — CBC
HCT: 42.5 % (ref 39.0–52.0)
Hemoglobin: 14.4 g/dL (ref 13.0–17.0)
MCH: 30.3 pg (ref 26.0–34.0)
MCHC: 33.9 g/dL (ref 30.0–36.0)
MCV: 89.3 fL (ref 78.0–100.0)
PLATELETS: 238 10*3/uL (ref 150–400)
RBC: 4.76 MIL/uL (ref 4.22–5.81)
RDW: 12.9 % (ref 11.5–15.5)
WBC: 5.3 10*3/uL (ref 4.0–10.5)

## 2015-08-26 LAB — D-DIMER, QUANTITATIVE (NOT AT ARMC)

## 2015-08-26 MED ORDER — FAMOTIDINE 20 MG PO TABS: 20.0000 mg | ORAL_TABLET | Freq: Two times a day (BID) | ORAL | 0 refills | Status: DC

## 2015-08-26 NOTE — ED Notes (Signed)
Esignature not working. Pt gives verbal acknowledgement of D./C instructions and is agreeable to DC

## 2015-08-26 NOTE — ED Provider Notes (Signed)
[facility]-EMERGENCY DEPT Provider Note   CSN: [id number] Arrival date & time: 08/26/15  4098  First Provider Contact:  First MD Initiated Contact with Patient 08/26/15 0645        History   Chief Complaint Chief Complaint  Patient presents with  . Chest Pain  . Numbness    hands and feet    HPI Hector Armstrong is a 40 y.o. male.  HPI Hector Armstrong is a 40 y.o. male with PMH significant for HIV on Atripla, last CD4 560 who presents with 1.5 week hx of intermittent, mild-moderate, substernal chest pain he describes as "heartburn or congestion" that will radiate to his right chest and will occasionally experience sharp b/l chest pains.  It lasts minutes when it happens.  Triggered by eating certain foods.  Relieved with antacids and Nexium.  No SOB or cough.  Currently denies any pain.  He states this has happened before.  Not triggered with exertion.  No family hx of SCD.  No tobacco use.  He is also complaining of 1 day history of sudden onset, intermittent b/l hand and feet numbness that comes on and lasts for less than 10 minutes.  When it happens it is painful.  It happens when he has been standing on his feet for long periods of time and resolves with sitting down.  No weakness. No swelling or erythema.  No fever.  No injury/trauma.  Has taken Advil.  Past Medical History:  Diagnosis Date  . HIV (human immunodeficiency virus infection) [facility])     Patient Active Problem List   Diagnosis Date Noted  . Screening examination for venereal disease 11/19/2014  . Encounter for long-term (current) use of medications 11/19/2014  . Irritable bowel syndrome 10/10/2012  . INSOMNIA 05/01/2008  . DENTAL CARIES 09/03/2007  . FACIAL RASH 10/02/2006  . INGUINAL HERNIA 07/11/2006  . HSV 06/19/2006  . SYPHILIS 06/19/2006  . Human immunodeficiency virus (HIV) disease (HCC) 06/14/2006    History reviewed. No pertinent surgical history.     Home Medications    Prior to Admission  medications   Medication Sig Start Date End Date Taking? Authorizing Provider  ATRIPLA 600-200-300 MG tablet TAKE 1 TABLET BY MOUTH AT BEDTIME 11/23/14  Yes Gardiner Barefoot, MD  famotidine (PEPCID) 20 MG tablet Take 1 tablet (20 mg total) by mouth 2 (two) times daily. 08/26/15   Cheri Fowler, PA-C  guaiFENesin (ROBITUSSIN) 100 MG/5ML liquid Take 5-10 mLs (100-200 mg total) by mouth every 4 (four) hours as needed for cough or congestion. Patient not taking: Reported on 05/20/2015 12/23/13   Loren Racer, MD  methocarbamol (ROBAXIN) 500 MG tablet Take 1 tablet (500 mg total) by mouth 2 (two) times daily. Patient not taking: Reported on 08/26/2015 06/13/15   Santiago Glad, PA-C  omeprazole (PRILOSEC) 20 MG capsule Take 1 capsule (20 mg total) by mouth daily. Patient not taking: Reported on 08/26/2015 06/13/15   Santiago Glad, PA-C    Family History No family history on file.  Social History Social History  Substance Use Topics  . Smoking status: Never Smoker  . Smokeless tobacco: Never Used  . Alcohol use No     Allergies   Review of patient's allergies indicates no known allergies.   Review of Systems Review of Systems All other systems negative unless otherwise stated in HPI   Physical Exam Updated Vital Signs BP 134/89   Pulse 68   Temp 98.2 F (36.8 C) (Oral)   Resp 16   Ht  6\' 1"  (1.854 m)   Wt 65.8 kg   SpO2 100%   BMI 19.13 kg/m   Physical Exam  Constitutional: He is oriented to person, place, and time. He appears well-developed and well-nourished.  Non-toxic appearance. He does not have a sickly appearance. He does not appear ill.  HENT:  Head: Normocephalic and atraumatic.  Mouth/Throat: Oropharynx is clear and moist.  Eyes: Conjunctivae are normal. Pupils are equal, round, and reactive to light.  Neck: Normal range of motion. Neck supple.  Cardiovascular: Normal rate, regular rhythm, normal heart sounds and intact distal pulses.   Pulses:      Radial pulses are  2+ on the right side, and 2+ on the left side.       Dorsalis pedis pulses are 2+ on the right side, and 2+ on the left side.  No lower extremity edema.  Pulmonary/Chest: Effort normal and breath sounds normal. No accessory muscle usage or stridor. No respiratory distress. He has no wheezes. He has no rhonchi. He has no rales.  Abdominal: Soft. Bowel sounds are normal. He exhibits no distension. There is no tenderness. There is no rebound. A hernia is present. Hernia confirmed positive in the right inguinal area (non-tender, easily reducible). Hernia confirmed negative in the left inguinal area.  Musculoskeletal: Normal range of motion.  Lymphadenopathy:    He has no cervical adenopathy. No inguinal adenopathy noted on the right or left side.  Neurological: He is alert and oriented to person, place, and time.  Speech clear without dysarthria. 5/5 strength in bilateral upper and lower extremities.  Sensation intact to light touch.   Skin: Skin is warm and dry. Capillary refill takes less than 2 seconds.  Psychiatric: He has a normal mood and affect. His behavior is normal.     ED Treatments / Results  Labs (all labs ordered are listed, but only abnormal results are displayed) Labs Reviewed  BASIC METABOLIC PANEL - Abnormal; Notable for the following:       Result Value   Glucose, Bld 114 (*)    All other components within normal limits  CBC  D-DIMER, QUANTITATIVE (NOT AT [facility])  Rosezena SensorI-STAT TROPOININ, ED    EKG  EKG Interpretation  Date/Time:  Thursday August 26 2015 06:13:34 EDT Ventricular Rate:  68 PR Interval:    QRS Duration: 89 QT Interval:  393 QTC Calculation: 418 R Axis:   66 Text Interpretation:  Sinus rhythm Consider left ventricular hypertrophy Confirmed by LIU MD, DANA [phone]([id number]) on 08/26/2015 7:12:25 AM       Radiology Dg Chest 2 View  Result Date: 08/26/2015 CLINICAL DATA:  Initial evaluation for acute chest pain. EXAM: CHEST  2 VIEW COMPARISON:  Prior radiograph  from 12/23/2013. FINDINGS: The cardiac and mediastinal silhouettes are stable in size and contour, and remain within normal limits. The lungs are normally inflated. No airspace consolidation, pleural effusion, or pulmonary edema is identified. There is no pneumothorax. No acute osseous abnormality identified. IMPRESSION: No active cardiopulmonary disease. Electronically Signed   By: Rise MuBenjamin  McClintock M.D.   On: 08/26/2015 06:49    Procedures Procedures (including critical care time)  Medications Ordered in ED Medications - No data to display   Initial Impression / Assessment and Plan / ED Course  I have reviewed the triage vital signs and the nursing notes.  Pertinent labs & imaging results that were available during my care of the patient were reviewed by me and considered in my medical decision making (see chart for  details).  Clinical Course   Patient presents with intermittent chest pain x 1.5 weeks.  Brought on by eating.  Relieved with antacids and nexium.  No CP at present.  Not exertional.  HEART score 1. EKG not acute.  Troponin normal.  Dimer negative.  Likely GERD.  Taking Nexium. Will add pepcid.    Also complaining of intermittent b/l hands and feet numbness after standing for long periods of time that is relieved with rest.  No weakness, swelling, or color change.  Good pulses.  Normal strength and sensation.  Doubt acute neurologic etiology.  No metabolic derangements.  Evaluation does not show pathology requiring ongoing emergent intervention or admission. Pt is hemodynamically stable and mentating appropriately. Discussed findings/results and plan with patient/guardian, who agrees with plan. All questions answered. Return precautions discussed and outpatient follow up given.   Case has been discussed with Dr. Clydene Pugh who agrees with the above plan for discharge.   Final Clinical Impressions(s) / ED Diagnoses   Final diagnoses:  Paresthesia  Atypical chest pain  Foot  pain, bilateral    New Prescriptions New Prescriptions   FAMOTIDINE (PEPCID) 20 MG TABLET    Take 1 tablet (20 mg total) by mouth 2 (two) times daily.     Cheri Fowler, PA-C 08/26/15 1610    Lyndal Pulley, MD 08/26/15 [phone]

## 2015-08-26 NOTE — ED Triage Notes (Addendum)
Pt has been having recurrent CP. Pt also c/o of numbness in bilateral hands and feet. Pt states it started yesterday afternoon. Pt states it went away and came back. "I don't know if it is a foot problem or what, due to the loss arch or what. My feet seem to hurt all the time." Pt c/o of CP that started over a week ago. It comes and goes. Pt describes it as heartburn or congestion and radiates to the R chest. Pt denies currently having the chest pain.  Pt denies all pain. 0/10 pain.   Pt voicing multiple complaints. Pt playing on phone while simultaneously answering questions. Pt respirations even and unlabored. Pt does not appear to be in any distress.  Pt c/o of shooting pains through both legs. Pt also c/o of Right sided groin pain. Pt denies any associated urinate symptoms.

## 2015-08-30 NOTE — Telephone Encounter (Signed)
He went to the ED. They thought is likely GERD so they added pepcid to his Nexium

## 2015-09-08 ENCOUNTER — Ambulatory Visit: Payer: Medicaid Other

## 2015-10-08 ENCOUNTER — Ambulatory Visit: Payer: Medicaid Other

## 2015-11-15 ENCOUNTER — Other Ambulatory Visit: Payer: Self-pay | Admitting: *Deleted

## 2015-11-15 DIAGNOSIS — B20 Human immunodeficiency virus [HIV] disease: Secondary | ICD-10-CM

## 2015-11-15 MED ORDER — EFAVIRENZ-EMTRICITAB-TENOFOVIR 600-200-300 MG PO TABS: 1.0000 | ORAL_TABLET | Freq: Every day | ORAL | 1 refills | Status: DC

## 2015-11-15 NOTE — Telephone Encounter (Signed)
Upcoming appt with Dr. Luciana Axeomer on 12/02/15.

## 2015-11-22 ENCOUNTER — Other Ambulatory Visit: Payer: Self-pay | Admitting: *Deleted

## 2015-11-22 DIAGNOSIS — B20 Human immunodeficiency virus [HIV] disease: Secondary | ICD-10-CM

## 2015-11-22 MED ORDER — EFAVIRENZ-EMTRICITAB-TENOFOVIR 600-200-300 MG PO TABS: 1.0000 | ORAL_TABLET | Freq: Every day | ORAL | 1 refills | Status: DC

## 2015-11-24 ENCOUNTER — Telehealth: Payer: Self-pay | Admitting: Pharmacist Clinician (PhC)/ Clinical Pharmacy Specialist

## 2015-11-24 NOTE — Telephone Encounter (Signed)
Hector Armstrong brought in a 90 days request for refill of ATP. He has been doing well on it. Talked to him today to see if he has any interest in changing it to something else. He has a upcoming appt with Dr Luciana Axeomer on 11/16 but he has he'll not be able to make it due to work. He asked me to reschedule for 12/5 instead. I did. Told him to ask about changing to another regimen and to see me that day. Still have ample supply til then.

## 2015-12-02 ENCOUNTER — Ambulatory Visit: Payer: Medicaid Other | Admitting: Internal Medicine

## 2015-12-21 ENCOUNTER — Encounter: Payer: Self-pay | Admitting: Internal Medicine

## 2015-12-21 ENCOUNTER — Ambulatory Visit (INDEPENDENT_AMBULATORY_CARE_PROVIDER_SITE_OTHER): Payer: BLUE CROSS/BLUE SHIELD | Admitting: Internal Medicine

## 2015-12-21 VITALS — BP 136/86 | HR 65 | Temp 97.5°F | Ht 71.0 in | Wt 140.0 lb

## 2015-12-21 DIAGNOSIS — G609 Hereditary and idiopathic neuropathy, unspecified: Secondary | ICD-10-CM | POA: Diagnosis not present

## 2015-12-21 DIAGNOSIS — Z79899 Other long term (current) drug therapy: Secondary | ICD-10-CM | POA: Diagnosis not present

## 2015-12-21 DIAGNOSIS — B20 Human immunodeficiency virus [HIV] disease: Secondary | ICD-10-CM | POA: Diagnosis not present

## 2015-12-21 MED ORDER — ELVITEG-COBIC-EMTRICIT-TENOFAF 150-150-200-10 MG PO TABS: 1.0000 | ORAL_TABLET | Freq: Every day | ORAL | 5 refills | Status: DC

## 2015-12-21 NOTE — Progress Notes (Signed)
  Subjective:    Patient ID: Hector Armstrong, male    DOB: 1975/11/08, 40 y.o.   MRN: [id number]  HPI He comes in for followup of his HIV.  He continues on Atripla and denies any missed doses. He continues to have good followup now.  His viral load though remains undetectable and a CD4 count of 560, last visit.  No labs prior to today's visit.   No weight loss, no nausea, no vomiting.  No missed doses.  Having some issues with numbness of hands and feet.  Saw his PCP and had a work up that was unrevealing.     Review of Systems  Constitutional: Negative for fatigue.  HENT: Negative for sore throat and trouble swallowing.   Gastrointestinal: Negative for diarrhea and nausea.  Skin: Negative for rash.  Neurological: Negative for dizziness, light-headedness and headaches.  Psychiatric/Behavioral: Negative for sleep disturbance.       Objective:   Physical Exam  Constitutional: He appears well-developed and well-nourished. No distress.  HENT:  Mouth/Throat: No oropharyngeal exudate.  Eyes: No scleral icterus.  Cardiovascular: Normal rate and regular rhythm.   Murmur heard. Unchanged murmur  Pulmonary/Chest: Effort normal. No respiratory distress. He has no wheezes.  Lymphadenopathy:    He has no cervical adenopathy.  Skin: No rash noted.   Social History   Social History  . Marital status: Married    Spouse name: N/A  . Number of children: N/A  . Years of education: N/A   Occupational History  . Not on file.   Social History Main Topics  . Smoking status: Never Smoker  . Smokeless tobacco: Never Used  . Alcohol use No  . Drug use: No  . Sexual activity: Not Currently    Partners: Female     Comment: pt. given condoms   Other Topics Concern  . Not on file   Social History Narrative  . No narrative on file         Assessment & Plan:

## 2015-12-21 NOTE — Assessment & Plan Note (Signed)
Normal creat with recent ED visit

## 2015-12-21 NOTE — Assessment & Plan Note (Signed)
Doing well.  I discussed TAF containing regimen and will change him to [facility].  Also discussed with PharmD and will rtc with PharmD in 2 weeks.   rtc with me in 6 months unless concerns on today's labs.

## 2015-12-21 NOTE — Assessment & Plan Note (Signed)
I suspect this is the cause of his symptoms and nothing revealing on work up.  Will defer to his PCP if any treatment indicated.

## 2015-12-21 NOTE — Progress Notes (Signed)
HPI: Hector Armstrong is a 40 y.o. male who presents to the [facility] today for follow-up with Dr. Luciana Axeomer for his HIV.  He has been on Atripla and Dr. Luciana Axeomer wanted me to talk to him regarding changing to a safer regimen.  Allergies: No Known Allergies  Past Medical History: Past Medical History:  Diagnosis Date  . HIV (human immunodeficiency virus infection) (HCC)     Social History: Social History   Social History  . Marital status: Married    Spouse name: N/A  . Number of children: N/A  . Years of education: N/A   Social History Main Topics  . Smoking status: Never Smoker  . Smokeless tobacco: Never Used  . Alcohol use No  . Drug use: No  . Sexual activity: Not Currently    Partners: Female     Comment: pt. given condoms   Other Topics Concern  . None   Social History Narrative  . None    Current Regimen: Atripla  Labs: HIV 1 RNA Quant (copies/mL)  Date Value  05/06/2015 <20  11/05/2014 <20  10/29/2013 <20   CD4 T Cell Abs (/uL)  Date Value  05/06/2015 560  11/05/2014 540  10/29/2013 540   Hep B S Ab (no units)  Date Value  10/01/2006 Positive   Hepatitis B Surface Ag (no units)  Date Value  06/14/2006 NEG   HCV Ab (no units)  Date Value  06/14/2006 NEG    CrCl: CrCl cannot be calculated (Patient's most recent lab result is older than the maximum 21 days allowed.).  Lipids:    Component Value Date/Time   CHOL 135 05/06/2015 0939   TRIG 38 05/06/2015 0939   HDL 72 05/06/2015 0939   CHOLHDL 1.9 05/06/2015 0939   VLDL 8 05/06/2015 0939   LDLCALC 55 05/06/2015 0939    Assessment: Hector Armstrong is here today for follow up of HIV.  He has been on Atripla for quite some time.  We want to change him over to [facility] and he had some questions.  I explained the difference between Genvoya and Atripla, including the newer tenofovir formulation in [place].  I explained the mental health side effects associated with Atripla and how Genvoya is better on  your bones and kidneys long term.  I also explained how we are trying to switch patient's off of Atripla on to something that is safer.  I answered all of his questions, and he is agreeable to change.  He will f/u with us in ~2 weeks.  Plans: - Stop Atripla - Start Genvoya 1 pill PO once daily - F/u with [facility] pharmacist in 2 weeks  Christine Schiefelbein L. Paulino DoorKuppelweiser, BS, PharmD Infectious Diseases Clinical Pharmacist [facility] 12/21/2015, 10:19 AM

## 2015-12-22 LAB — T-HELPER CELL (CD4) - (RCID CLINIC ONLY)
CD4 T CELL ABS: 590 /uL (ref 400–2700)
CD4 T CELL HELPER: 27 % — AB (ref 33–55)

## 2015-12-23 LAB — HIV-1 RNA QUANT-NO REFLEX-BLD
HIV 1 RNA Quant: <20 copies/mL (ref <20)
HIV-1 RNA Quant, Log: <1.3 Log copies/mL (ref <1.30)

## 2016-01-03 ENCOUNTER — Ambulatory Visit: Payer: BLUE CROSS/BLUE SHIELD | Admitting: Pharmacist

## 2016-01-03 DIAGNOSIS — B20 Human immunodeficiency virus [HIV] disease: Secondary | ICD-10-CM

## 2016-01-03 MED ORDER — ELVITEG-COBIC-EMTRICIT-TENOFAF 150-150-200-10 MG PO TABS: 1.0000 | ORAL_TABLET | Freq: Every day | ORAL | 5 refills | Status: DC

## 2016-01-03 MED FILL — GENVOYA TABLET: 150-150-200 | 30 days supply | Qty: 30 | Fill #0

## 2016-01-03 NOTE — Progress Notes (Signed)
HPI: Hector Armstrong is a 40 y.o. male who presents to the [facility] pharmacy clinic today to follow-up for his HIV infection.  I recently saw him with Dr. Luciana Axeomer and switched him from Atripla to [place].   Allergies: No Known Allergies  Past Medical History: Past Medical History:  Diagnosis Date  . HIV (human immunodeficiency virus infection) (HCC)     Social History: Social History   Social History  . Marital status: Married    Spouse name: N/A  . Number of children: N/A  . Years of education: N/A   Social History Main Topics  . Smoking status: Never Smoker  . Smokeless tobacco: Never Used  . Alcohol use No  . Drug use: No  . Sexual activity: Not Currently    Partners: Female     Comment: pt. given condoms   Other Topics Concern  . Not on file   Social History Narrative  . No narrative on file    Current Regimen: Genvoya  Labs: HIV 1 RNA Quant (copies/mL)  Date Value  12/21/2015 <20  05/06/2015 <20  11/05/2014 <20   CD4 T Cell Abs (/uL)  Date Value  12/21/2015 590  05/06/2015 560  11/05/2014 540   Hep B S Ab (no units)  Date Value  10/01/2006 Positive   Hepatitis B Surface Ag (no units)  Date Value  06/14/2006 NEG   HCV Ab (no units)  Date Value  06/14/2006 NEG    CrCl: CrCl cannot be calculated (Patient's most recent lab result is older than the maximum 21 days allowed.).  Lipids:    Component Value Date/Time   CHOL 135 05/06/2015 0939   TRIG 38 05/06/2015 0939   HDL 72 05/06/2015 0939   CHOLHDL 1.9 05/06/2015 0939   VLDL 8 05/06/2015 0939   LDLCALC 55 05/06/2015 0939    Assessment: Hector Armstrong is here to follow up after starting Genvoya.  I saw him with Dr. Luciana Axeomer back on 12/5 and he was supposed to start [facility], but he tells me today that he never received the Genvoya. He tells me that he gets his medications through [facility], which is where [facility].  They never contacted him or anything.  I asked what insurance he had and  he has [employer] [place]through [employer] so [facility] to see if we could fill it, and we can.  Sent it in and we took care of his $950 copay with a copay assistance card.  [facility] and picked up his Genovya today ~10am. I will make another f/u appointment in ~1 month to assess adherence and tolerability since he has not started the Genvoya yet.  He has a f/u with Dr. Luciana Axeomer in June. His labs look great.  He asked what to do with the extra Atripla, and I asked him to bring it over here to the clinic and we could dispose of it properly.  Plans: - Stop Atripla - Start Genvoya 1 tablet PO [facility] - F/u with me again on 1/3 at 10am - Labs 06/19/16 at 9:15am - F/u with Dr. Luciana Axeomer 07/04/16 at 8:45am  Aloysius Heinle L. Paulino DoorKuppelweiser, BS, PharmD Infectious Diseases Clinical Pharmacist [facility] 01/03/2016, 2:43 PM

## 2016-01-19 ENCOUNTER — Ambulatory Visit: Payer: BLUE CROSS/BLUE SHIELD | Admitting: Pharmacist

## 2016-01-19 DIAGNOSIS — B20 Human immunodeficiency virus [HIV] disease: Secondary | ICD-10-CM

## 2016-01-19 NOTE — Progress Notes (Signed)
HPI: Hector Armstrong is a 41 y.o. male who presents to the [facility] clinic today for follow-up of his HIV. He recently switched from Atripla to [place].  Allergies: No Known Allergies  Past Medical History: Past Medical History:  Diagnosis Date  . HIV (human immunodeficiency virus infection) (HCC)     Social History: Social History   Social History  . Marital status: Married    Spouse name: N/A  . Number of children: N/A  . Years of education: N/A   Social History Main Topics  . Smoking status: Never Smoker  . Smokeless tobacco: Never Used  . Alcohol use No  . Drug use: No  . Sexual activity: Not Currently    Partners: Female     Comment: pt. given condoms   Other Topics Concern  . Not on file   Social History Narrative  . No narrative on file    Current Regimen: Genvoya  Labs: HIV 1 RNA Quant (copies/mL)  Date Value  12/21/2015 <20  05/06/2015 <20  11/05/2014 <20   CD4 T Cell Abs (/uL)  Date Value  12/21/2015 590  05/06/2015 560  11/05/2014 540   Hep B S Ab (no units)  Date Value  10/01/2006 Positive   Hepatitis B Surface Ag (no units)  Date Value  06/14/2006 NEG   HCV Ab (no units)  Date Value  06/14/2006 NEG    CrCl: CrCl cannot be calculated (Patient's most recent lab result is older than the maximum 21 days allowed.).  Lipids:    Component Value Date/Time   CHOL 135 05/06/2015 0939   TRIG 38 05/06/2015 0939   HDL 72 05/06/2015 0939   CHOLHDL 1.9 05/06/2015 0939   VLDL 8 05/06/2015 0939   LDLCALC 55 05/06/2015 0939    Assessment: Hector Armstrong is here today for follow-up of his HIV.  He recently switched from Atripla to [place].  He is not having any issues whatsoever. No headaches, nausea, vomiting, diarrhea, or any side effects.  He is not having back problems anymore or sleep problems.  He has no issues right now. Gave him our number if he needs anything.  Plans: - Continue Genvoya once daily - Lab appt 6/4 at 9:15am - F/u  with Dr. Luciana Axeomer 6/26 at 8:763m  Tekeshia Klahr L. Paulino DoorKuppelweiser, BS, PharmD Infectious Diseases Clinical Pharmacist [facility] 01/19/2016, 4:47 PM

## 2016-02-01 MED FILL — GENVOYA TABLET: 150-150-200 | 30 days supply | Qty: 30 | Fill #1 | Status: TO

## 2016-02-29 MED FILL — GENVOYA TABLET: 150-150-200 | 30 days supply | Qty: 30 | Fill #0

## 2016-04-12 MED FILL — GENVOYA TABLET: 150-150-200 | 30 days supply | Qty: 30 | Fill #1

## 2016-05-11 MED FILL — GENVOYA TABLET: 150-150-200 | 30 days supply | Qty: 30 | Fill #2

## 2016-06-19 ENCOUNTER — Other Ambulatory Visit: Payer: BLUE CROSS/BLUE SHIELD

## 2016-06-23 ENCOUNTER — Other Ambulatory Visit: Payer: BLUE CROSS/BLUE SHIELD

## 2016-06-23 DIAGNOSIS — B20 Human immunodeficiency virus [HIV] disease: Secondary | ICD-10-CM

## 2016-06-23 DIAGNOSIS — Z113 Encounter for screening for infections with a predominantly sexual mode of transmission: Secondary | ICD-10-CM

## 2016-06-23 DIAGNOSIS — Z79899 Other long term (current) drug therapy: Secondary | ICD-10-CM

## 2016-06-23 LAB — CBC WITH DIFFERENTIAL/PLATELET
BASOS ABS: 43 {cells}/uL (ref 0–200)
Basophils Relative: 1 %
EOS ABS: 215 {cells}/uL (ref 15–500)
EOS PCT: 5 %
HCT: 41.7 % (ref 38.5–50.0)
Hemoglobin: 14.2 g/dL (ref 13.2–17.1)
LYMPHS PCT: 36 %
Lymphs Abs: 1548 cells/uL (ref 850–3900)
MCH: 30.7 pg (ref 27.0–33.0)
MCHC: 34.1 g/dL (ref 32.0–36.0)
MCV: 90.1 fL (ref 80.0–100.0)
MONOS PCT: 15 %
MPV: 10.3 fL (ref 7.5–12.5)
Monocytes Absolute: 645 cells/uL (ref 200–950)
Neutro Abs: 1849 cells/uL (ref 1500–7800)
Neutrophils Relative %: 43 %
PLATELETS: 287 10*3/uL (ref 140–400)
RBC: 4.63 MIL/uL (ref 4.20–5.80)
RDW: 13.9 % (ref 11.0–15.0)
WBC: 4.3 10*3/uL (ref 3.8–10.8)

## 2016-06-23 LAB — LIPID PANEL
CHOLESTEROL: 165 mg/dL (ref <200)
HDL: 68 mg/dL (ref >40)
LDL Cholesterol: 73 mg/dL (ref <100)
Total CHOL/HDL Ratio: 2.4 Ratio (ref <5.0)
Triglycerides: 120 mg/dL (ref <150)
VLDL: 24 mg/dL (ref <30)

## 2016-06-23 LAB — BASIC METABOLIC PANEL
BUN: 17 mg/dL (ref 7–25)
CALCIUM: 9.2 mg/dL (ref 8.6–10.3)
CO2: 25 mmol/L (ref 20–31)
CREATININE: 1.01 mg/dL (ref 0.60–1.35)
Chloride: 107 mmol/L (ref 98–110)
Glucose, Bld: 80 mg/dL (ref 65–99)
Potassium: 3.7 mmol/L (ref 3.5–5.3)
Sodium: 139 mmol/L (ref 135–146)

## 2016-06-23 LAB — T-HELPER CELL (CD4) - (RCID CLINIC ONLY)
CD4 T CELL HELPER: 30 % — AB (ref 33–55)
CD4 T Cell Abs: 550 /uL (ref 400–2700)

## 2016-06-23 MED FILL — GENVOYA TABLET: 150-150-200 | 30 days supply | Qty: 30 | Fill #3

## 2016-06-24 LAB — RPR

## 2016-06-26 LAB — URINE CYTOLOGY ANCILLARY ONLY
CHLAMYDIA, DNA PROBE: NEGATIVE
NEISSERIA GONORRHEA: NEGATIVE

## 2016-06-27 LAB — HIV-1 RNA QUANT-NO REFLEX-BLD
HIV 1 RNA Quant: 23 copies/mL — ABNORMAL HIGH
HIV-1 RNA Quant, Log: 1.36 Log copies/mL — ABNORMAL HIGH

## 2016-07-04 ENCOUNTER — Ambulatory Visit: Payer: BLUE CROSS/BLUE SHIELD | Admitting: Internal Medicine

## 2016-07-11 ENCOUNTER — Ambulatory Visit: Payer: BLUE CROSS/BLUE SHIELD | Admitting: Internal Medicine

## 2016-07-17 ENCOUNTER — Ambulatory Visit (INDEPENDENT_AMBULATORY_CARE_PROVIDER_SITE_OTHER): Payer: BLUE CROSS/BLUE SHIELD | Admitting: Internal Medicine

## 2016-07-17 ENCOUNTER — Encounter: Payer: Self-pay | Admitting: Internal Medicine

## 2016-07-17 VITALS — BP 127/83 | HR 62 | Temp 98.3°F | Ht 71.0 in | Wt 136.0 lb

## 2016-07-17 DIAGNOSIS — Z113 Encounter for screening for infections with a predominantly sexual mode of transmission: Secondary | ICD-10-CM | POA: Diagnosis not present

## 2016-07-17 DIAGNOSIS — B20 Human immunodeficiency virus [HIV] disease: Secondary | ICD-10-CM

## 2016-07-17 DIAGNOSIS — Z5181 Encounter for therapeutic drug level monitoring: Secondary | ICD-10-CM | POA: Insufficient documentation

## 2016-07-17 NOTE — Assessment & Plan Note (Signed)
Doing well on the new regimen.  No changes and rtc 6 months

## 2016-07-17 NOTE — Assessment & Plan Note (Signed)
Creat good.  Will check yearly

## 2016-07-17 NOTE — Progress Notes (Signed)
  Subjective:    Patient ID: Philippa ChesterFabian Disbrow, male    DOB: January 02, 1976, 41 y.o.   MRN: [id number]  HPI He comes in for followup of his HIV.  He previously was on Atripla and I changed her to [place].  After some delay with his insurance he has not changed and tolerating well.  No weight loss, no associated nausea, no vomiting.  No missed doses.  [facility] [address].  Uses condoms with sex. Some congestion for about 6 months.  Uses nasal spray.    Review of Systems  Constitutional: Negative for fatigue.  HENT: Positive for congestion.   Gastrointestinal: Negative for diarrhea and nausea.  Skin: Negative for rash.  Neurological: Negative for dizziness.  Psychiatric/Behavioral: Negative for sleep disturbance.       Objective:   Physical Exam  Constitutional: He appears well-developed and well-nourished. No distress.  HENT:  Mouth/Throat: No oropharyngeal exudate.  Eyes: No scleral icterus.  Cardiovascular: Normal rate and regular rhythm.   Murmur heard. Unchanged murmur  Pulmonary/Chest: Effort normal. No respiratory distress. He has no wheezes.  Lymphadenopathy:    He has no cervical adenopathy.  Skin: No rash noted.         Assessment & Plan:

## 2016-07-17 NOTE — Assessment & Plan Note (Signed)
Screened negative 

## 2016-08-01 ENCOUNTER — Other Ambulatory Visit: Payer: Self-pay | Admitting: Internal Medicine

## 2016-08-01 MED FILL — GENVOYA TABLET: 150-150-200 | 30 days supply | Qty: 30 | Fill #0

## 2016-09-15 MED FILL — GENVOYA TABLET: 150-150-200 | 30 days supply | Qty: 30 | Fill #1

## 2016-10-13 ENCOUNTER — Other Ambulatory Visit: Payer: Self-pay | Admitting: Pharmacist

## 2016-10-24 MED FILL — GENVOYA TABLET: 150-150-200 | 30 days supply | Qty: 30 | Fill #2

## 2016-11-24 MED FILL — GENVOYA TABLET: 150-150-200 | 30 days supply | Qty: 30 | Fill #3

## 2016-12-18 ENCOUNTER — Other Ambulatory Visit: Payer: Self-pay | Admitting: Internal Medicine

## 2016-12-21 MED FILL — GENVOYA TABLET: 150-150-200 | 30 days supply | Qty: 30 | Fill #0

## 2017-01-02 ENCOUNTER — Other Ambulatory Visit: Payer: BLUE CROSS/BLUE SHIELD

## 2017-01-02 DIAGNOSIS — B20 Human immunodeficiency virus [HIV] disease: Secondary | ICD-10-CM

## 2017-01-03 LAB — T-HELPER CELL (CD4) - (RCID CLINIC ONLY)
CD4 T CELL ABS: 670 /uL (ref 400–2700)
CD4 T CELL HELPER: 28 % — AB (ref 33–55)

## 2017-01-04 ENCOUNTER — Other Ambulatory Visit: Payer: BLUE CROSS/BLUE SHIELD

## 2017-01-04 LAB — HIV-1 RNA QUANT-NO REFLEX-BLD
HIV 1 RNA Quant: 23 copies/mL — ABNORMAL HIGH
HIV-1 RNA QUANT, LOG: 1.36 {Log_copies}/mL — AB

## 2017-01-18 ENCOUNTER — Ambulatory Visit (INDEPENDENT_AMBULATORY_CARE_PROVIDER_SITE_OTHER): Payer: BLUE CROSS/BLUE SHIELD | Admitting: Licensed Clinical Social Worker

## 2017-01-18 ENCOUNTER — Encounter: Payer: Self-pay | Admitting: Internal Medicine

## 2017-01-18 ENCOUNTER — Ambulatory Visit (INDEPENDENT_AMBULATORY_CARE_PROVIDER_SITE_OTHER): Payer: BLUE CROSS/BLUE SHIELD | Admitting: Internal Medicine

## 2017-01-18 VITALS — BP 126/78 | HR 70 | Temp 98.3°F | Ht 71.0 in | Wt 138.0 lb

## 2017-01-18 DIAGNOSIS — F4323 Adjustment disorder with mixed anxiety and depressed mood: Secondary | ICD-10-CM

## 2017-01-18 DIAGNOSIS — Z79899 Other long term (current) drug therapy: Secondary | ICD-10-CM

## 2017-01-18 DIAGNOSIS — R0981 Nasal congestion: Secondary | ICD-10-CM

## 2017-01-18 DIAGNOSIS — Z113 Encounter for screening for infections with a predominantly sexual mode of transmission: Secondary | ICD-10-CM

## 2017-01-18 DIAGNOSIS — B20 Human immunodeficiency virus [HIV] disease: Secondary | ICD-10-CM | POA: Diagnosis not present

## 2017-01-18 NOTE — Assessment & Plan Note (Signed)
He will continue to use OTC medications.  Will need to avoid flonase on Genvoya.

## 2017-01-18 NOTE — Assessment & Plan Note (Signed)
Doing well and rtc 6 months 

## 2017-01-18 NOTE — BH Specialist Note (Signed)
Integrated Behavioral Health Initial Visit  MRN: [id number] Name: Hector Armstrong  Number of Integrated Behavioral Health Clinician visits:: 1/6 Session Start time: 9:41 am  Session End time: 10:00 am Total time: 20 minutes  Type of Service: Integrated Behavioral Health- Individual/Family Interpretor:No. Interpretor Name and Language: N/A   Warm Hand Off Completed.       SUBJECTIVE: Hector Armstrong is a 42 y.o. male accompanied by self Patient was referred by Dr. Luciana Axeomer for work stress.  Patient reports the following symptoms/concerns: Patient reported increased responsibility at work and lack of support from management.  Patient was able to list the coping strategies he is currently using as: Distraction from work by interacting with his children and at work thinking of providing for his family when he is angry.  Patient reported that the coping strategies have been helpful but not enough and was receptive to techniques offered from the [facility].    [facility] provided the following interventions: thought monitoring to prevent reinforcement of negative thoughts, thought reframing, use of gratitude, breaking down larger work goals into smaller achievable goals, seek ways to add stress relieving techniques into the work day or break, and seek opportunities for fun adult activities without the kids, as part of good self-care.   Severity of problem: mild  OBJECTIVE: Mood: Anxious and Affect: within range Risk of harm to self or others: No plan to harm self or others  LIFE CONTEXT: Family and Social: Patient has been married for 17 years and has two children.  Patient does not engage in fun age-appropriate activities. School/Work: Patient works at [employer]. Self-Care: Patient is able to tend to his ADL's but does not engage in good self-care. Life Changes: Patient was recently promoted at work and has increased responsibility.  ASSESSMENT: Patient is currently experiencing increased work stress  and responsibility and may benefit from behavioral health services to learn additional coping strategies.  GOALS ADDRESSED: Patient will: 1. Reduce symptoms of: anxiety and stress 2. Increase knowledge and/or ability of: coping skills, healthy habits and stress reduction  3. Demonstrate ability to: Increase healthy adjustment to current life circumstances and Increase age-appropriate fun activities.  INTERVENTIONS: Interventions utilized: Solution-Focused Strategies and Mindfulness or Relaxation Training   PLAN: 1. Patient was provided with a business card and will contact the [facility] if symptoms worsen or do not get better. 2. Behavioral recommendations: Break work tasks into smaller achievable goals, seek ways to add stress relieving techniques into the day at work, seek opportunities for age-appropriate fun activities without children, use thought monitoring to prevent reinforcement of negative thoughts, use thought reframing to change perspective, use gratitude.   Vergia AlbertsSherry Mattia Liford, [facility]

## 2017-01-18 NOTE — Progress Notes (Signed)
  Subjective:    Patient ID: Hector ChesterFabian Armstrong, male    DOB: [birth year], 42 y.o.   MRN: [id number]  HPI He comes in for followup of his HIV.  He continues on Genvoya with no issues.  No weight loss, no associated nausea, no vomiting.  No missed doses.  [facility] [address].  Uses condoms with sex.  Having some stress at work, [employer] to working a lot.     Review of Systems  Constitutional: Negative for fatigue.  HENT: Positive for congestion.   Gastrointestinal: Negative for diarrhea and nausea.  Skin: Negative for rash.  Neurological: Negative for dizziness.  Psychiatric/Behavioral: Negative for sleep disturbance.       Objective:   Physical Exam  Constitutional: He appears well-developed and well-nourished. No distress.  HENT:  Mouth/Throat: No oropharyngeal exudate.  Eyes: No scleral icterus.  Cardiovascular: Normal rate and regular rhythm.  Murmur heard. Unchanged murmur  Pulmonary/Chest: Effort normal. No respiratory distress. He has no wheezes.  Lymphadenopathy:    He has no cervical adenopathy.  Skin: No rash noted.         Assessment & Plan:

## 2017-01-25 MED FILL — GENVOYA TABLET: 150-150-200 | 30 days supply | Qty: 30 | Fill #1

## 2017-01-29 ENCOUNTER — Other Ambulatory Visit: Payer: Self-pay | Admitting: *Deleted

## 2017-01-29 ENCOUNTER — Telehealth: Payer: Self-pay | Admitting: *Deleted

## 2017-01-29 DIAGNOSIS — Z1159 Encounter for screening for other viral diseases: Secondary | ICD-10-CM

## 2017-01-29 NOTE — Telephone Encounter (Signed)
RN spoke with Dr. Luciana Axeomer.  Verbal order for Hep C VL in 3 weeks and obtain Tdap.  Patient scheduled for 02/20/17.

## 2017-01-29 NOTE — Progress Notes (Signed)
Verbal order per Dr. Luciana Axeomer for Hep C Viral Load in 3 weeks.

## 2017-02-10 ENCOUNTER — Ambulatory Visit (HOSPITAL_COMMUNITY)
Admission: EM | Admit: 2017-02-10 | Discharge: 2017-02-10 | Disposition: A | Discharge status: Home / Self Care | Payer: BLUE CROSS/BLUE SHIELD | Attending: Family Medicine | Admitting: Family Medicine

## 2017-02-10 ENCOUNTER — Encounter (HOSPITAL_COMMUNITY): Payer: Self-pay | Admitting: Family Medicine

## 2017-02-10 DIAGNOSIS — Z21 Asymptomatic human immunodeficiency virus [HIV] infection status: Secondary | ICD-10-CM

## 2017-02-10 DIAGNOSIS — Z79899 Other long term (current) drug therapy: Secondary | ICD-10-CM | POA: Diagnosis not present

## 2017-02-10 DIAGNOSIS — B20 Human immunodeficiency virus [HIV] disease: Secondary | ICD-10-CM | POA: Diagnosis not present

## 2017-02-10 DIAGNOSIS — R3989 Other symptoms and signs involving the genitourinary system: Secondary | ICD-10-CM | POA: Diagnosis not present

## 2017-02-10 DIAGNOSIS — Z202 Contact with and (suspected) exposure to infections with a predominantly sexual mode of transmission: Secondary | ICD-10-CM | POA: Diagnosis not present

## 2017-02-10 MED ORDER — CEFTRIAXONE SODIUM 250 MG IJ SOLR
INTRAMUSCULAR | Status: AC
  Filled 2017-02-10: qty 250

## 2017-02-10 MED ORDER — AZITHROMYCIN 250 MG PO TABS
ORAL_TABLET | ORAL | Status: AC
  Filled 2017-02-10: qty 4

## 2017-02-10 MED ORDER — CEFTRIAXONE SODIUM 250 MG IJ SOLR
250.0000 mg | Freq: Once | INTRAMUSCULAR | Status: AC
  Administered 2017-02-10: 250 mg via INTRAMUSCULAR

## 2017-02-10 MED ORDER — AZITHROMYCIN 250 MG PO TABS
1000.0000 mg | ORAL_TABLET | Freq: Once | ORAL | Status: AC
  Administered 2017-02-10: 1000 mg via ORAL

## 2017-02-10 NOTE — Discharge Instructions (Signed)
It takes 2-3 days for your results to come back. No news is good news, however.  Follow up with PCP if things do not normalize.
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
## 2017-02-10 NOTE — ED Triage Notes (Signed)
Pt here today for dysuria. Reports that he had unprotected sex and thinks he has an STD. Denies any rashes or lesions. Denies penile discharge.

## 2017-02-12 LAB — URINE CYTOLOGY ANCILLARY ONLY
Chlamydia: NEGATIVE
NEISSERIA GONORRHEA: NEGATIVE
TRICH (WINDOWPATH): NEGATIVE

## 2017-02-14 ENCOUNTER — Encounter (HOSPITAL_COMMUNITY): Payer: Self-pay | Admitting: Emergency Medicine

## 2017-02-14 ENCOUNTER — Ambulatory Visit (HOSPITAL_COMMUNITY)
Admission: EM | Admit: 2017-02-14 | Discharge: 2017-02-14 | Disposition: A | Discharge status: Home / Self Care | Payer: BLUE CROSS/BLUE SHIELD | Attending: Family Medicine | Admitting: Family Medicine

## 2017-02-14 DIAGNOSIS — R300 Dysuria: Secondary | ICD-10-CM | POA: Diagnosis not present

## 2017-02-14 DIAGNOSIS — N342 Other urethritis: Secondary | ICD-10-CM | POA: Diagnosis not present

## 2017-02-14 LAB — POCT URINALYSIS DIP (DEVICE)
Bilirubin Urine: NEGATIVE
GLUCOSE, UA: NEGATIVE mg/dL
Hgb urine dipstick: NEGATIVE
Ketones, ur: NEGATIVE mg/dL
LEUKOCYTES UA: NEGATIVE
NITRITE: NEGATIVE
PROTEIN: NEGATIVE mg/dL
Specific Gravity, Urine: >=1.03 (ref 1.005–1.030)
UROBILINOGEN UA: 0.2 mg/dL (ref 0.0–1.0)
pH: 6 (ref 5.0–8.0)

## 2017-02-14 NOTE — ED Triage Notes (Signed)
PT C/O: dysuria onset this am associated w/penis pain.... Reports he was seen on 02/10/17 for STI check   Results were negative... Given to pt today  Sts he is concerned b/c last week he was in a bachelors party and had oral sex and since then, appeared.   DENIES: fevers  TAKING MEDS: none  A&O x4... NAD... Ambulatory

## 2017-02-19 NOTE — ED Provider Notes (Signed)
  [facility]   [id number] 02/14/17 Arrival Time: 1038  ASSESSMENT & PLAN:  1. Urethritis    Urine unremarkable. Recently treated for GC/Chlamydia with negative screening. No sexual activity since. No discharge.  Pending: Labs Reviewed  POCT URINALYSIS DIP (DEVICE)   Monitor symptoms closely. Will schedule f/u with PCP or urology.  Reviewed expectations re: course of current medical issues. Questions answered. Outlined signs and symptoms indicating need for more acute intervention. Patient verbalized understanding. After Visit Summary given.   SUBJECTIVE:  Hector Armstrong is a 42 y.o. male who presents with complaint of slight penile "irritation when I pee." Noticed today. No penile d/c or bleeding. Normal urinary habits. Recently treated for GC/Chlamydia with negative screening. No sexual activity since. No rashes/lesions. Afebrile. No OTC treatment. No testicular pain.  ROS: As per HPI.  OBJECTIVE:  Vitals:   02/14/17 1154  BP: (!) 143/99  Pulse: 81  Resp: 20  Temp: 98.5 F (36.9 C)  TempSrc: Oral  SpO2: 100%    General appearance: alert, cooperative, appears stated age and no distress Throat: lips, mucosa, and tongue normal; teeth and gums normal Back: no CVA tenderness Abdomen: soft, non-tender; bowel sounds normal; no masses or organomegaly; no guarding or rebound tenderness GU: normal GU exam Skin: warm and dry Psychological:  Alert and cooperative. Normal mood and affect.  Results for orders placed or performed during the hospital encounter of 02/14/17  POCT urinalysis dip (device)  Result Value Ref Range   Glucose, UA NEGATIVE NEGATIVE mg/dL   Bilirubin Urine NEGATIVE NEGATIVE   Ketones, ur NEGATIVE NEGATIVE mg/dL   Specific Gravity, Urine >=1.030 1.005 - 1.030   Hgb urine dipstick NEGATIVE NEGATIVE   pH 6.0 5.0 - 8.0   Protein, ur NEGATIVE NEGATIVE mg/dL   Urobilinogen, UA 0.2 0.0 - 1.0 mg/dL   Nitrite NEGATIVE NEGATIVE   Leukocytes, UA  NEGATIVE NEGATIVE    Labs Reviewed  POCT URINALYSIS DIP (DEVICE)    No Known Allergies  Past Medical History:  Diagnosis Date  . HIV (human immunodeficiency virus infection) (HCC)    History reviewed. No pertinent family history. Social History   Socioeconomic History  . Marital status: Married    Spouse name: Not on file  . Number of children: Not on file  . Years of education: Not on file  . Highest education level: Not on file  Social Needs  . Financial resource strain: Not on file  . Food insecurity - worry: Not on file  . Food insecurity - inability: Not on file  . Transportation needs - medical: Not on file  . Transportation needs - non-medical: Not on file  Occupational History  . Not on file  Tobacco Use  . Smoking status: Never Smoker  . Smokeless tobacco: Never Used  Substance and Sexual Activity  . Alcohol use: No  . Drug use: No  . Sexual activity: Not Currently    Partners: Female    Comment: pt. given condoms  Other Topics Concern  . Not on file  Social History Narrative  . Not on file          Mardella LaymanHagler, Suriyah Vergara, MD 02/19/17 [phone]

## 2017-02-20 ENCOUNTER — Other Ambulatory Visit: Payer: Self-pay | Admitting: Pharmacist

## 2017-02-20 ENCOUNTER — Other Ambulatory Visit: Payer: BLUE CROSS/BLUE SHIELD

## 2017-02-20 ENCOUNTER — Ambulatory Visit (INDEPENDENT_AMBULATORY_CARE_PROVIDER_SITE_OTHER): Payer: BLUE CROSS/BLUE SHIELD | Admitting: *Deleted

## 2017-02-20 DIAGNOSIS — Z23 Encounter for immunization: Secondary | ICD-10-CM | POA: Diagnosis not present

## 2017-02-20 DIAGNOSIS — IMO0001 Reserved for inherently not codable concepts without codable children: Secondary | ICD-10-CM

## 2017-02-20 DIAGNOSIS — Z113 Encounter for screening for infections with a predominantly sexual mode of transmission: Secondary | ICD-10-CM

## 2017-02-20 DIAGNOSIS — W273XXA Contact with needle (sewing), initial encounter: Principal | ICD-10-CM

## 2017-02-20 DIAGNOSIS — S61239A Puncture wound without foreign body of unspecified finger without damage to nail, initial encounter: Secondary | ICD-10-CM

## 2017-02-20 DIAGNOSIS — Z79899 Other long term (current) drug therapy: Secondary | ICD-10-CM

## 2017-02-20 DIAGNOSIS — B20 Human immunodeficiency virus [HIV] disease: Secondary | ICD-10-CM

## 2017-02-20 DIAGNOSIS — Z1159 Encounter for screening for other viral diseases: Secondary | ICD-10-CM

## 2017-02-20 NOTE — Progress Notes (Signed)
Patient here for nurse visit at advice from [place], [place].  Patient reports a needle stick injury ~ 3 weeks ago. He states he was at a gas station placing trash into the garbage can when he felt a sharp prick on the heel of his right hand. Per the patient, he didn't notice any bleeding. RN asked if he washed it with soap/water, sought attention at that time - patient denied.   Today the area he indicated as injured appears normal - no swelling or redness, no streaking, no visible injury.  Patient's questions about why he was being tested for hepatitis C were answered to his satisfaction.  RN added Hepatitis B lab (he was not immunized in our records, but has a positive surface antibody).  Andree CossHowell, Tacie Mccuistion M, RN

## 2017-02-21 LAB — CBC WITH DIFFERENTIAL/PLATELET
BASOS PCT: 1.3 %
Basophils Absolute: 61 cells/uL (ref 0–200)
EOS PCT: 7.5 %
Eosinophils Absolute: 353 cells/uL (ref 15–500)
HEMATOCRIT: 40.2 % (ref 38.5–50.0)
HEMOGLOBIN: 13.9 g/dL (ref 13.2–17.1)
LYMPHS ABS: 2035 {cells}/uL (ref 850–3900)
MCH: 31 pg (ref 27.0–33.0)
MCHC: 34.6 g/dL (ref 32.0–36.0)
MCV: 89.7 fL (ref 80.0–100.0)
MPV: 10.8 fL (ref 7.5–12.5)
Monocytes Relative: 14.4 %
NEUTROS ABS: 1575 {cells}/uL (ref 1500–7800)
Neutrophils Relative %: 33.5 %
Platelets: 238 10*3/uL (ref 140–400)
RBC: 4.48 10*6/uL (ref 4.20–5.80)
RDW: 12.4 % (ref 11.0–15.0)
Total Lymphocyte: 43.3 %
WBC mixed population: 677 cells/uL (ref 200–950)
WBC: 4.7 10*3/uL (ref 3.8–10.8)

## 2017-02-21 LAB — COMPLETE METABOLIC PANEL WITH GFR
AG Ratio: 1.8 (calc) (ref 1.0–2.5)
ALBUMIN MSPROF: 4.2 g/dL (ref 3.6–5.1)
ALT: 11 U/L (ref 9–46)
AST: 18 U/L (ref 10–40)
Alkaline phosphatase (APISO): 47 U/L (ref 40–115)
BUN: 17 mg/dL (ref 7–25)
CALCIUM: 9 mg/dL (ref 8.6–10.3)
CO2: 24 mmol/L (ref 20–32)
CREATININE: 1.13 mg/dL (ref 0.60–1.35)
Chloride: 106 mmol/L (ref 98–110)
GFR, EST AFRICAN AMERICAN: 93 mL/min/{1.73_m2} (ref >=60)
GFR, EST NON AFRICAN AMERICAN: 80 mL/min/{1.73_m2} (ref >=60)
GLUCOSE: 93 mg/dL (ref 65–99)
Globulin: 2.3 g/dL (calc) (ref 1.9–3.7)
Potassium: 3.9 mmol/L (ref 3.5–5.3)
Sodium: 139 mmol/L (ref 135–146)
TOTAL PROTEIN: 6.5 g/dL (ref 6.1–8.1)
Total Bilirubin: 0.3 mg/dL (ref 0.2–1.2)

## 2017-02-21 LAB — RPR: RPR Ser Ql: REACTIVE — AB

## 2017-02-21 LAB — HEPATITIS C ANTIBODY
Hepatitis C Ab: NONREACTIVE
SIGNAL TO CUT-OFF: 0.02 (ref <1.00)

## 2017-02-21 LAB — FLUORESCENT TREPONEMAL AB(FTA)-IGG-BLD: Fluorescent Treponemal ABS: REACTIVE — AB

## 2017-02-21 LAB — T-HELPER CELL (CD4) - (RCID CLINIC ONLY)
CD4 % Helper T Cell: 27 % — ABNORMAL LOW (ref 33–55)
CD4 T CELL ABS: 570 /uL (ref 400–2700)

## 2017-02-21 LAB — LIPID PANEL
CHOL/HDL RATIO: 2.1 (calc) (ref <5.0)
CHOLESTEROL: 162 mg/dL (ref <200)
HDL: 79 mg/dL (ref >40)
LDL Cholesterol (Calc): 69 mg/dL (calc)
Non-HDL Cholesterol (Calc): 83 mg/dL (calc) (ref <130)
Triglycerides: 56 mg/dL (ref <150)

## 2017-02-21 LAB — HEPATITIS B SURFACE ANTIBODY,QUALITATIVE: HEP B S AB: NONREACTIVE

## 2017-02-21 LAB — RPR TITER: RPR Titer: 1:1 {titer} — ABNORMAL HIGH

## 2017-02-22 LAB — HIV-1 RNA QUANT-NO REFLEX-BLD
HIV 1 RNA QUANT: NOT DETECTED {copies}/mL
HIV-1 RNA QUANT, LOG: NOT DETECTED {Log_copies}/mL

## 2017-02-28 ENCOUNTER — Encounter (HOSPITAL_COMMUNITY): Payer: Self-pay | Admitting: Emergency Medicine

## 2017-02-28 ENCOUNTER — Ambulatory Visit (HOSPITAL_COMMUNITY)
Admission: EM | Admit: 2017-02-28 | Discharge: 2017-02-28 | Disposition: A | Discharge status: Home / Self Care | Payer: BLUE CROSS/BLUE SHIELD | Attending: Family Medicine | Admitting: Family Medicine

## 2017-02-28 DIAGNOSIS — R300 Dysuria: Secondary | ICD-10-CM | POA: Diagnosis not present

## 2017-02-28 MED ORDER — DOXYCYCLINE HYCLATE 100 MG PO TABS: 100.0000 mg | ORAL_TABLET | Freq: Two times a day (BID) | ORAL | 0 refills | Status: DC

## 2017-02-28 MED FILL — GENVOYA TABLET: 150-150-200 | 30 days supply | Qty: 30 | Fill #2

## 2017-02-28 NOTE — ED Provider Notes (Signed)
[facility]   [id number] 02/28/17 Arrival Time: 1000   SUBJECTIVE:  Hector Armstrong is a 42 y.o. male who presents to the urgent care with complaint of burning at the end of urination which is persistent.  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 Was treated with azithromycin last visit.   Past Medical History:  Diagnosis Date  . HIV (human immunodeficiency virus infection) (HCC)    No family history on file. Social History   Socioeconomic History  . Marital status: Married    Spouse name: Not on file  . Number of children: Not on file  . Years of education: Not on file  . Highest education level: Not on file  Social Needs  . Financial resource strain: Not on file  . Food insecurity - worry: Not on file  . Food insecurity - inability: Not on file  . Transportation needs - medical: Not on file  . Transportation needs - non-medical: Not on file  Occupational History  . Not on file  Tobacco Use  . Smoking status: Never Smoker  . Smokeless tobacco: Never Used  Substance and Sexual Activity  . Alcohol use: No  . Drug use: No  . Sexual activity: Not Currently    Partners: Female    Comment: pt. given condoms  Other Topics Concern  . Not on file  Social History Narrative  . Not on file   No outpatient medications have been marked as taking for the 02/28/17 encounter [facility](Hospital Encounter).   No Known Allergies    ROS: As per HPI, remainder of ROS negative.   OBJECTIVE:   Vitals:   02/28/17 1019  BP: (!) 167/94  Pulse: 66  Resp: 18  Temp: 98.1 F (36.7 C)  TempSrc: Oral  SpO2: 100%     General appearance: alert; no distress Eyes: PERRL; EOMI; conjunctiva normal HENT: normocephalic; atraumatic;  Neck: supple Extremities: no cyanosis or edema; symmetrical with no gross deformities Skin: warm and dry Neurologic: normal gait; grossly normal Psychological: alert and  cooperative; normal mood and affect      Labs:  Results for orders placed or performed in visit on 02/20/17  Hepatitis C Antibody  Result Value Ref Range   Hepatitis C Ab NON-REACTIVE NON-REACTI   SIGNAL TO CUT-OFF 0.02 <1.00  RPR  Result Value Ref Range   RPR Ser Ql REACTIVE (A) NON-REACTI  Lipid panel  Result Value Ref Range   Cholesterol 162 <200 mg/dL   HDL 79 >19>40 mg/dL   Triglycerides 56 <147<150 mg/dL   LDL Cholesterol (Calc) 69 mg/dL (calc)   Total CHOL/HDL Ratio 2.1 <5.0 (calc)   Non-HDL Cholesterol (Calc) 83 <829<130 mg/dL (calc)  HIV 1 RNA quant-no reflex-bld  Result Value Ref Range   HIV 1 RNA Quant <20 NOT DETECTED NOT DETECT copies/mL   HIV-1 RNA Quant, Log <1.30 NOT DETECTED NOT DETECT Log copies/mL  T-helper cell (CD4)- ([facility] clinic only)  Result Value Ref Range   CD4 T Cell Abs 570 400 - 2,700 /uL   CD4 % Helper T Cell 27 (L) 33 - 55 %  CBC with Differential/Platelet  Result Value Ref Range   WBC 4.7 3.8 - 10.8 Thousand/uL   RBC 4.48 4.20 - 5.80 Million/uL   Hemoglobin 13.9 13.2 - 17.1 g/dL   HCT 56.240.2 13.038.5 - 86.550.0 %   MCV 89.7 80.0 - 100.0 fL   MCH 31.0 27.0 - 33.0  pg   MCHC 34.6 32.0 - 36.0 g/dL   RDW 16.1 09.6 - 04.5 %   Platelets 238 140 - 400 Thousand/uL   MPV 10.8 7.5 - 12.5 fL   Neutro Abs 1,575 1,500 - 7,800 cells/uL   Lymphs Abs 2,035 850 - 3,900 cells/uL   WBC mixed population 677 200 - 950 cells/uL   Eosinophils Absolute 353 15 - 500 cells/uL   Basophils Absolute 61 0 - 200 cells/uL   Neutrophils Relative % 33.5 %   Total Lymphocyte 43.3 %   Monocytes Relative 14.4 %   Eosinophils Relative 7.5 %   Basophils Relative 1.3 %  COMPLETE METABOLIC PANEL WITH GFR  Result Value Ref Range   Glucose, Bld 93 65 - 99 mg/dL   BUN 17 7 - 25 mg/dL   Creat 4.09 8.11 - 9.14 mg/dL   GFR, Est Non African American 80 > OR = 60 mL/min/1.35m2   GFR, Est African American 93 > OR = 60 mL/min/1.39m2   BUN/Creatinine Ratio NOT APPLICABLE 6 - 22 (calc)   Sodium 139  135 - 146 mmol/L   Potassium 3.9 3.5 - 5.3 mmol/L   Chloride 106 98 - 110 mmol/L   CO2 24 20 - 32 mmol/L   Calcium 9.0 8.6 - 10.3 mg/dL   Total Protein 6.5 6.1 - 8.1 g/dL   Albumin 4.2 3.6 - 5.1 g/dL   Globulin 2.3 1.9 - 3.7 g/dL (calc)   AG Ratio 1.8 1.0 - 2.5 (calc)   Total Bilirubin 0.3 0.2 - 1.2 mg/dL   Alkaline phosphatase (APISO) 47 40 - 115 U/L   AST 18 10 - 40 U/L   ALT 11 9 - 46 U/L  Hepatitis B Surface AntiBODY  Result Value Ref Range   Hep B S Ab NON-REACTIVE NON-REACTI  Rpr titer  Result Value Ref Range   RPR Titer 1:1 (H)   Fluorescent treponemal ab(fta)-IgG-bld  Result Value Ref Range   Fluorescent Treponemal ABS REACTIVE (A) NON-REACTI    Labs Reviewed - No data to display  No results found.     ASSESSMENT & PLAN:  1. Dysuria     Meds ordered this encounter  Medications  . doxycycline (VIBRA-TABS) 100 MG tablet    Sig: Take 1 tablet (100 mg total) by mouth 2 (two) times daily.    Dispense:  20 tablet    Refill:  0    Reviewed expectations re: course of current medical issues. Questions answered. Outlined signs and symptoms indicating need for more acute intervention. Patient verbalized understanding. After Visit Summary given.    Procedures:      Elvina Sidle, MD 02/28/17 1027

## 2017-02-28 NOTE — ED Triage Notes (Signed)
Pt was treated for STDs and it was negative. They also checked his urine and it was good. Pt still having burning with urination.

## 2017-03-16 ENCOUNTER — Other Ambulatory Visit: Payer: Self-pay | Admitting: Pharmacist

## 2017-03-27 MED FILL — GENVOYA TABLET: 150-150-200 | 30 days supply | Qty: 30 | Fill #3

## 2017-04-24 ENCOUNTER — Other Ambulatory Visit: Payer: Self-pay | Admitting: Internal Medicine

## 2017-04-25 MED FILL — GENVOYA TABLET: 150-150-200 | 30 days supply | Qty: 30 | Fill #0

## 2017-05-25 MED FILL — GENVOYA TABLET: 150-150-200 | 30 days supply | Qty: 30 | Fill #1

## 2017-06-19 ENCOUNTER — Other Ambulatory Visit: Payer: Self-pay | Admitting: *Deleted

## 2017-06-19 DIAGNOSIS — B20 Human immunodeficiency virus [HIV] disease: Secondary | ICD-10-CM

## 2017-06-26 MED FILL — GENVOYA TABLET: 150-150-200 | 30 days supply | Qty: 30 | Fill #2

## 2017-06-27 ENCOUNTER — Other Ambulatory Visit (HOSPITAL_COMMUNITY)
Admission: RE | Admit: 2017-06-27 | Discharge: 2017-06-27 | Disposition: A | Discharge status: Home / Self Care | Payer: BLUE CROSS/BLUE SHIELD | Source: Ambulatory Visit | Attending: Internal Medicine | Admitting: Internal Medicine

## 2017-06-27 ENCOUNTER — Other Ambulatory Visit: Payer: BLUE CROSS/BLUE SHIELD

## 2017-06-27 DIAGNOSIS — B20 Human immunodeficiency virus [HIV] disease: Secondary | ICD-10-CM

## 2017-06-27 DIAGNOSIS — Z113 Encounter for screening for infections with a predominantly sexual mode of transmission: Secondary | ICD-10-CM

## 2017-06-28 LAB — T-HELPER CELL (CD4) - (RCID CLINIC ONLY)
CD4 % Helper T Cell: 27 % — ABNORMAL LOW (ref 33–55)
CD4 T CELL ABS: 700 /uL (ref 400–2700)

## 2017-06-28 LAB — URINE CYTOLOGY ANCILLARY ONLY
CHLAMYDIA, DNA PROBE: NEGATIVE
NEISSERIA GONORRHEA: NEGATIVE

## 2017-06-29 LAB — HIV-1 RNA QUANT-NO REFLEX-BLD
HIV 1 RNA QUANT: NOT DETECTED {copies}/mL
HIV-1 RNA QUANT, LOG: NOT DETECTED {Log_copies}/mL

## 2017-07-03 ENCOUNTER — Other Ambulatory Visit: Payer: BLUE CROSS/BLUE SHIELD

## 2017-07-18 ENCOUNTER — Ambulatory Visit (INDEPENDENT_AMBULATORY_CARE_PROVIDER_SITE_OTHER): Payer: BLUE CROSS/BLUE SHIELD | Admitting: Internal Medicine

## 2017-07-18 ENCOUNTER — Encounter: Payer: Self-pay | Admitting: Internal Medicine

## 2017-07-18 VITALS — BP 155/106 | HR 103 | Temp 97.9°F | Ht 71.0 in | Wt 140.8 lb

## 2017-07-18 DIAGNOSIS — B20 Human immunodeficiency virus [HIV] disease: Secondary | ICD-10-CM | POA: Diagnosis not present

## 2017-07-18 DIAGNOSIS — Z113 Encounter for screening for infections with a predominantly sexual mode of transmission: Secondary | ICD-10-CM | POA: Diagnosis not present

## 2017-07-18 DIAGNOSIS — Z23 Encounter for immunization: Secondary | ICD-10-CM | POA: Insufficient documentation

## 2017-07-18 DIAGNOSIS — Z79899 Other long term (current) drug therapy: Secondary | ICD-10-CM | POA: Diagnosis not present

## 2017-07-18 MED ORDER — PNEUMOCOCCAL 13-VAL CONJ VACC IM SUSP
0.5000 mL | Freq: Once | INTRAMUSCULAR | Status: AC
  Administered 2017-07-18: 0.5 mL via INTRAMUSCULAR

## 2017-07-18 MED ORDER — MENINGOCOCCAL A C Y&W-135 OLIG IM SOLR
0.5000 mL | Freq: Once | INTRAMUSCULAR | Status: AC
  Administered 2017-07-18: 0.5 mL via INTRAMUSCULAR

## 2017-07-18 NOTE — Assessment & Plan Note (Addendum)
Counseled and given today Also gave Menveo

## 2017-07-18 NOTE — Assessment & Plan Note (Signed)
Doing well.  Discussed that he should find a clinic in the city he will move to ahead of time and we can send records.  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

## 2017-07-18 NOTE — Progress Notes (Signed)
   Subjective:    Patient ID: Hector Armstrong, male    DOB: [birth year], 42 y.o.   MRN: [id number]  HPI Here for follow up of HIV. Continues on Genvoya and denies any missed doses.  No associated rash or diarrhea.  CD4 of 700 and viral load remains suppressed.  [place]      Review of Systems  Constitutional: Negative for fatigue.  Skin: Negative for rash.       Objective:   Physical Exam  Constitutional: He appears well-developed. No distress.  HENT:  Mouth/Throat: No oropharyngeal exudate.  Eyes: No scleral icterus.  Cardiovascular: Normal rate, regular rhythm and normal heart sounds.  No murmur heard. Pulmonary/Chest: Effort normal and breath sounds normal. No respiratory distress.  Skin: No rash noted.          Assessment & Plan:

## 2017-07-24 ENCOUNTER — Other Ambulatory Visit: Payer: Self-pay | Admitting: Physician Assistant

## 2017-07-24 ENCOUNTER — Ambulatory Visit
Admission: RE | Admit: 2017-07-24 | Discharge: 2017-07-24 | Disposition: A | Discharge status: Home / Self Care | Payer: BLUE CROSS/BLUE SHIELD | Source: Ambulatory Visit | Attending: Physician Assistant | Admitting: Physician Assistant

## 2017-07-24 DIAGNOSIS — M7989 Other specified soft tissue disorders: Secondary | ICD-10-CM

## 2017-07-26 MED FILL — GENVOYA TABLET: 150-150-200 | 30 days supply | Qty: 30 | Fill #3

## 2017-08-23 ENCOUNTER — Other Ambulatory Visit: Payer: Self-pay | Admitting: Internal Medicine

## 2017-08-27 MED FILL — GENVOYA TABLET: 150-150-200 | 30 days supply | Qty: 30 | Fill #0

## 2017-09-28 MED FILL — GENVOYA TABLET: 150-150-200 | 30 days supply | Qty: 30 | Fill #1

## 2017-10-29 MED FILL — GENVOYA TABLET: 150-150-200 | 30 days supply | Qty: 30 | Fill #2

## 2017-11-07 ENCOUNTER — Other Ambulatory Visit: Payer: BLUE CROSS/BLUE SHIELD

## 2017-11-07 ENCOUNTER — Ambulatory Visit (INDEPENDENT_AMBULATORY_CARE_PROVIDER_SITE_OTHER): Payer: BLUE CROSS/BLUE SHIELD | Admitting: Behavioral Health

## 2017-11-07 DIAGNOSIS — Z23 Encounter for immunization: Secondary | ICD-10-CM | POA: Diagnosis not present

## 2017-11-23 MED FILL — GENVOYA TABLET: 150-150-200 | 30 days supply | Qty: 30 | Fill #3

## 2017-12-27 MED FILL — GENVOYA TABLET: 150-150-200 | 30 days supply | Qty: 30 | Fill #4

## 2018-01-02 ENCOUNTER — Other Ambulatory Visit: Payer: BLUE CROSS/BLUE SHIELD

## 2018-01-02 ENCOUNTER — Other Ambulatory Visit (HOSPITAL_COMMUNITY)
Admission: RE | Admit: 2018-01-02 | Discharge: 2018-01-02 | Disposition: A | Discharge status: Home / Self Care | Payer: BLUE CROSS/BLUE SHIELD | Source: Ambulatory Visit | Attending: Internal Medicine | Admitting: Internal Medicine

## 2018-01-02 DIAGNOSIS — Z113 Encounter for screening for infections with a predominantly sexual mode of transmission: Secondary | ICD-10-CM | POA: Insufficient documentation

## 2018-01-02 DIAGNOSIS — B20 Human immunodeficiency virus [HIV] disease: Secondary | ICD-10-CM | POA: Diagnosis not present

## 2018-01-02 DIAGNOSIS — Z79899 Other long term (current) drug therapy: Secondary | ICD-10-CM

## 2018-01-03 LAB — T-HELPER CELL (CD4) - (RCID CLINIC ONLY)
CD4 % Helper T Cell: 27 % — ABNORMAL LOW (ref 33–55)
CD4 T Cell Abs: 510 /uL (ref 400–2700)

## 2018-01-03 LAB — URINE CYTOLOGY ANCILLARY ONLY
Chlamydia: NEGATIVE
NEISSERIA GONORRHEA: NEGATIVE

## 2018-01-04 LAB — COMPLETE METABOLIC PANEL WITH GFR
AG Ratio: 1.7 (calc) (ref 1.0–2.5)
ALKALINE PHOSPHATASE (APISO): 54 U/L (ref 40–115)
ALT: 14 U/L (ref 9–46)
AST: 23 U/L (ref 10–40)
Albumin: 4.5 g/dL (ref 3.6–5.1)
BUN: 17 mg/dL (ref 7–25)
CALCIUM: 9.3 mg/dL (ref 8.6–10.3)
CO2: 27 mmol/L (ref 20–32)
CREATININE: 1.28 mg/dL (ref 0.60–1.35)
Chloride: 104 mmol/L (ref 98–110)
GFR, EST NON AFRICAN AMERICAN: 69 mL/min/{1.73_m2} (ref >=60)
GFR, Est African American: 79 mL/min/{1.73_m2} (ref >=60)
GLOBULIN: 2.7 g/dL (ref 1.9–3.7)
GLUCOSE: 100 mg/dL — AB (ref 65–99)
Potassium: 3.9 mmol/L (ref 3.5–5.3)
Sodium: 138 mmol/L (ref 135–146)
Total Bilirubin: 0.5 mg/dL (ref 0.2–1.2)
Total Protein: 7.2 g/dL (ref 6.1–8.1)

## 2018-01-04 LAB — CBC WITH DIFFERENTIAL/PLATELET
Absolute Monocytes: 403 cells/uL (ref 200–950)
BASOS ABS: 19 {cells}/uL (ref 0–200)
Basophils Relative: 0.5 %
EOS PCT: 4.4 %
Eosinophils Absolute: 163 cells/uL (ref 15–500)
HEMATOCRIT: 42.5 % (ref 38.5–50.0)
HEMOGLOBIN: 15.1 g/dL (ref 13.2–17.1)
LYMPHS ABS: 1880 {cells}/uL (ref 850–3900)
MCH: 30.9 pg (ref 27.0–33.0)
MCHC: 35.5 g/dL (ref 32.0–36.0)
MCV: 87.1 fL (ref 80.0–100.0)
MONOS PCT: 10.9 %
MPV: 10.5 fL (ref 7.5–12.5)
NEUTROS ABS: 1236 {cells}/uL — AB (ref 1500–7800)
NEUTROS PCT: 33.4 %
Platelets: 247 10*3/uL (ref 140–400)
RBC: 4.88 10*6/uL (ref 4.20–5.80)
RDW: 12.7 % (ref 11.0–15.0)
Total Lymphocyte: 50.8 %
WBC: 3.7 10*3/uL — AB (ref 3.8–10.8)

## 2018-01-04 LAB — LIPID PANEL
CHOL/HDL RATIO: 2.5 (calc) (ref <5.0)
Cholesterol: 176 mg/dL (ref <200)
HDL: 70 mg/dL (ref >40)
LDL Cholesterol (Calc): 92 mg/dL (calc)
NON-HDL CHOLESTEROL (CALC): 106 mg/dL (ref <130)
Triglycerides: 57 mg/dL (ref <150)

## 2018-01-04 LAB — FLUORESCENT TREPONEMAL AB(FTA)-IGG-BLD: FLUORESCENT TREPONEMAL ABS: REACTIVE — AB

## 2018-01-04 LAB — HIV-1 RNA QUANT-NO REFLEX-BLD
HIV 1 RNA QUANT: DETECTED {copies}/mL — AB
HIV-1 RNA Quant, Log: <1.3 Log copies/mL — AB

## 2018-01-04 LAB — RPR: RPR Ser Ql: REACTIVE — AB

## 2018-01-04 LAB — RPR TITER: RPR Titer: 1:1 {titer} — ABNORMAL HIGH

## 2018-01-23 ENCOUNTER — Ambulatory Visit (INDEPENDENT_AMBULATORY_CARE_PROVIDER_SITE_OTHER): Payer: BLUE CROSS/BLUE SHIELD | Admitting: Internal Medicine

## 2018-01-23 ENCOUNTER — Encounter: Payer: Self-pay | Admitting: Internal Medicine

## 2018-01-23 VITALS — BP 162/89 | HR 69 | Temp 97.3°F | Wt 137.0 lb

## 2018-01-23 DIAGNOSIS — Z79899 Other long term (current) drug therapy: Secondary | ICD-10-CM

## 2018-01-23 DIAGNOSIS — Z23 Encounter for immunization: Secondary | ICD-10-CM

## 2018-01-23 DIAGNOSIS — B20 Human immunodeficiency virus [HIV] disease: Secondary | ICD-10-CM

## 2018-01-23 NOTE — Progress Notes (Signed)
   Subjective:    Patient ID: Hector Armstrong, male    DOB: [birth year], 43 y.o.   MRN: [id number]  HPI Here for follow up of HIV. Continues on Genvoya and denies any missed doses.  No associated rash or diarrhea.  CD4 of 510 and viral load remains suppressed.  No new issues.      Review of Systems  Constitutional: Negative for fatigue.  Skin: Negative for rash.       Objective:   Physical Exam Constitutional:      General: He is not in acute distress.    Appearance: He is well-developed.  HENT:     Mouth/Throat:     Pharynx: No oropharyngeal exudate.  Eyes:     General: No scleral icterus. Cardiovascular:     Rate and Rhythm: Normal rate and regular rhythm.     Heart sounds: Normal heart sounds. No murmur.  Pulmonary:     Effort: Pulmonary effort is normal. No respiratory distress.     Breath sounds: Normal breath sounds.  Skin:    Findings: No rash.   SH: no tobacco        Assessment & Plan:

## 2018-01-23 NOTE — Assessment & Plan Note (Signed)
Discussed Menveo and #2 given today 

## 2018-01-23 NOTE — Assessment & Plan Note (Signed)
Doing well, no changes.  rtc 6 months.  

## 2018-01-23 NOTE — Assessment & Plan Note (Signed)
Cholesterol good, will defer further lipid panels to his PCP

## 2018-01-24 ENCOUNTER — Ambulatory Visit: Payer: BLUE CROSS/BLUE SHIELD | Admitting: Pharmacy Technician

## 2018-01-24 ENCOUNTER — Other Ambulatory Visit: Payer: Self-pay | Admitting: Pharmacist

## 2018-01-24 DIAGNOSIS — B20 Human immunodeficiency virus [HIV] disease: Secondary | ICD-10-CM

## 2018-01-24 MED ORDER — ELVITEG-COBIC-EMTRICIT-TENOFAF 150-150-200-10 MG PO TABS: 1.0000 | ORAL_TABLET | Freq: Every day | ORAL | 11 refills | Status: DC

## 2018-01-24 NOTE — Progress Notes (Signed)
Patient's insurance requires that he fill at [facility].

## 2018-04-02 MED FILL — GENVOYA TABLET: 150-150-200 | 30 days supply | Qty: 30 | Fill #5

## 2018-04-20 ENCOUNTER — Other Ambulatory Visit: Payer: Self-pay | Admitting: Internal Medicine

## 2018-04-20 DIAGNOSIS — B20 Human immunodeficiency virus [HIV] disease: Secondary | ICD-10-CM

## 2018-04-30 ENCOUNTER — Other Ambulatory Visit: Payer: Self-pay | Admitting: Pharmacist

## 2018-04-30 NOTE — Progress Notes (Signed)
Patient is able to fill at [facility] again. Kathie Rhodes will call to see about pick up/shipment.

## 2018-05-01 MED FILL — GENVOYA TABLET: 150-150-200 | 30 days supply | Qty: 30 | Fill #0

## 2018-05-30 MED FILL — GENVOYA TABLET: 150-150-200 | 30 days supply | Qty: 30 | Fill #1

## 2018-06-28 ENCOUNTER — Encounter: Payer: Self-pay | Admitting: Pharmacy Technician

## 2018-07-01 MED FILL — GENVOYA TABLET: 150-150-200 | 30 days supply | Qty: 30 | Fill #2

## 2018-08-01 MED FILL — GENVOYA TABLET: 150-150-200 | 30 days supply | Qty: 30 | Fill #3

## 2018-08-26 ENCOUNTER — Other Ambulatory Visit: Payer: BLUE CROSS/BLUE SHIELD

## 2018-09-04 MED FILL — GENVOYA TABLET: 150-150-200 | 30 days supply | Qty: 30 | Fill #4

## 2018-09-09 ENCOUNTER — Encounter: Payer: BLUE CROSS/BLUE SHIELD | Admitting: Internal Medicine

## 2018-09-17 ENCOUNTER — Ambulatory Visit (INDEPENDENT_AMBULATORY_CARE_PROVIDER_SITE_OTHER): Payer: BC Managed Care – PPO | Admitting: Pharmacist

## 2018-09-17 ENCOUNTER — Other Ambulatory Visit: Payer: Self-pay

## 2018-09-17 ENCOUNTER — Other Ambulatory Visit: Payer: BC Managed Care – PPO

## 2018-09-17 DIAGNOSIS — Z23 Encounter for immunization: Secondary | ICD-10-CM | POA: Diagnosis not present

## 2018-09-17 DIAGNOSIS — B20 Human immunodeficiency virus [HIV] disease: Secondary | ICD-10-CM

## 2018-09-17 NOTE — Progress Notes (Signed)
   [facility] for [facility] Disease Pharmacy Vaccination Visit  HPI: Hector Armstrong is a 43 y.o. male who presents to the [facility] clinic for his flu vaccine.  Hepatitis B Lab Results  Component Value Date   HEPBSAB NON-REACTIVE 02/20/2017   Lab Results  Component Value Date   HEPBSAG NEG 06/14/2006    Hepatitis C Lab Results  Component Value Date   HCVAB NEG 06/14/2006    Hepatitis A Lab Results  Component Value Date   HAV NEG 06/01/2011    Assessment & Plan: Flu vaccine given to patient.  Mitali Shenefield L. Evangelia Whitaker, PharmD, BCIDP, AAHIVP, [place] for Infectious Disease 09/17/2018, 2:15 PM

## 2018-09-18 LAB — T-HELPER CELL (CD4) - (RCID CLINIC ONLY)
CD4 % Helper T Cell: 27 % — ABNORMAL LOW (ref 33–65)
CD4 T Cell Abs: 601 /uL (ref 400–1790)

## 2018-09-21 LAB — HIV-1 RNA QUANT-NO REFLEX-BLD
HIV 1 RNA Quant: <20 copies/mL
HIV-1 RNA Quant, Log: <1.3 Log copies/mL

## 2018-09-26 MED FILL — GENVOYA TABLET: 150-150-200 | 30 days supply | Qty: 30 | Fill #5

## 2018-10-01 ENCOUNTER — Other Ambulatory Visit: Payer: Self-pay

## 2018-10-01 ENCOUNTER — Ambulatory Visit (INDEPENDENT_AMBULATORY_CARE_PROVIDER_SITE_OTHER): Payer: BC Managed Care – PPO | Admitting: Internal Medicine

## 2018-10-01 ENCOUNTER — Encounter: Payer: Self-pay | Admitting: Internal Medicine

## 2018-10-01 DIAGNOSIS — Z79899 Other long term (current) drug therapy: Secondary | ICD-10-CM | POA: Diagnosis not present

## 2018-10-01 DIAGNOSIS — Z113 Encounter for screening for infections with a predominantly sexual mode of transmission: Secondary | ICD-10-CM | POA: Diagnosis not present

## 2018-10-01 DIAGNOSIS — B20 Human immunodeficiency virus [HIV] disease: Secondary | ICD-10-CM

## 2018-10-01 NOTE — Progress Notes (Signed)
I connected with  Hector Armstrong on 10/01/18 by an audio enabled telemedicine application and verified that I am speaking with the correct person using two identifiers.   I discussed the limitations of evaluation and management by telemedicine. The patient expressed understanding and agreed to proceed.  HPI: Follow up for HIV Doing well with his Genvoya and no missed doses.  No new complaints or issues.   CD4 of 601, viral load < 20.  Got his flu shot when he came for labs.    A/P HIV - doing well and no changes.  RTC 6 months  Time of call 8 minutes to discuss results.

## 2018-11-01 ENCOUNTER — Other Ambulatory Visit: Payer: Self-pay | Admitting: Pharmacist

## 2018-11-01 DIAGNOSIS — B20 Human immunodeficiency virus [HIV] disease: Secondary | ICD-10-CM

## 2018-11-01 MED ORDER — GENVOYA 150-150-200-10 MG PO TABS: 1.0000 | ORAL_TABLET | Freq: Every day | ORAL | 5 refills | Status: DC

## 2018-11-01 MED FILL — GENVOYA TABLET: 150-150-200 | 30 days supply | Qty: 30 | Fill #0

## 2018-11-01 NOTE — Progress Notes (Signed)
Sending Genvoya refills to [facility].

## 2018-12-11 MED FILL — GENVOYA TABLET: 150-150-200 | 30 days supply | Qty: 30 | Fill #1

## 2019-01-20 MED FILL — GENVOYA TABLET: 150-150-200 | 30 days supply | Qty: 30 | Fill #2

## 2019-01-28 ENCOUNTER — Telehealth: Payer: Self-pay

## 2019-01-28 NOTE — Telephone Encounter (Signed)
Oral surgery office called about previously faxed clearance forms on 1//5/21 and 01/22/19. LPN verified office was sending forms to appropriate fax number. Asked to refax forms and will place in Dr. Ephriam Knuckles box for review.  Valarie Cones

## 2019-01-28 NOTE — Telephone Encounter (Signed)
Form received requesting most recent labs and MD signature. Placed in Dr. Ephriam Knuckles box for review. Hector Armstrong

## 2019-01-28 NOTE — Telephone Encounter (Signed)
Faxed with labs to number on form. Andree Coss, RN

## 2019-02-24 MED FILL — GENVOYA TABLET: 150-150-200 | 30 days supply | Qty: 30 | Fill #3

## 2019-03-05 ENCOUNTER — Telehealth: Payer: Self-pay

## 2019-03-05 NOTE — Telephone Encounter (Signed)
Patient's wife called office today stating her husband is being tested for covid. Is showing symptoms; cough, fever, body chills. Wife tested negative today. Would like to know if patient should take any additional precautions due to HIV status.  Advised patient's wife to make sure patient is isolated from anyone who is not showing symptoms/tested neg. To avoid infecting others in the household. Also to make sure masks are being worn for any close contact.Also advised patient drink plenty of fluids especially for fever, and take tylenol/ ibuprofen.  Advised family follow CDC guidelines until results are back. Will call office with update. Lorenso Courier, [place]

## 2019-03-31 ENCOUNTER — Telehealth: Payer: Self-pay | Admitting: Pharmacy Technician

## 2019-03-31 MED FILL — GENVOYA TABLET: 150-150-200 | 30 days supply | Qty: 30 | Fill #4

## 2019-03-31 NOTE — Telephone Encounter (Addendum)
[facility] Patient Advocate Encounter   I was successful in securing patient a $7500 grant from [employer] (PAF) to provide copayment coverage for Genvoya. This will make the out of pocket cost $0.     I have spoken with the patient and it will ship from [place] to arrive to his home this week.    The billing information is as follows and has been shared with [facility].   RxBin: [id number] PCN: [id number] Member ID: [id number] Group ID: [id number] Dates of Eligibility: 03/31/2019 through 03/30/2020    Patient knows to call the office with questions or concerns.  Beulah Gandy, CPhT Specialty Pharmacy Patient [facility] for Infectious Disease Phone: [phone] Fax: [phone] 03/31/2019 2:59 PM

## 2019-05-02 MED FILL — GENVOYA TABLET: 150-150-200 | 30 days supply | Qty: 30 | Fill #5

## 2019-06-02 ENCOUNTER — Ambulatory Visit: Payer: BC Managed Care – PPO | Admitting: Internal Medicine

## 2019-07-02 ENCOUNTER — Ambulatory Visit: Payer: BC Managed Care – PPO | Admitting: Internal Medicine

## 2019-07-03 ENCOUNTER — Telehealth: Payer: Self-pay

## 2019-07-03 NOTE — Telephone Encounter (Signed)
COVID-19 Pre-Screening Questions:07/03/19  Do you currently have a fever (>100 F), chills or unexplained body aches? NO   . Are you currently experiencing new cough, shortness of breath, sore throat, runny nose? NO .  Have you recently travelled outside the state of [place] in the last 14 days?NO .  Have you been in contact with someone that is currently pending confirmation of Covid19 testing or has been confirmed to have the Covid19 virus? NO **If the patient answers NO to ALL questions -  advise the patient to please call the clinic before coming to the office should any symptoms develop.     

## 2019-07-04 ENCOUNTER — Other Ambulatory Visit: Payer: BC Managed Care – PPO

## 2019-07-04 ENCOUNTER — Other Ambulatory Visit (HOSPITAL_COMMUNITY)
Admission: RE | Admit: 2019-07-04 | Discharge: 2019-07-04 | Disposition: A | Discharge status: Home / Self Care | Payer: BC Managed Care – PPO | Source: Ambulatory Visit | Attending: Internal Medicine | Admitting: Internal Medicine

## 2019-07-04 ENCOUNTER — Other Ambulatory Visit: Payer: Self-pay

## 2019-07-04 DIAGNOSIS — B20 Human immunodeficiency virus [HIV] disease: Secondary | ICD-10-CM | POA: Insufficient documentation

## 2019-07-04 DIAGNOSIS — Z79899 Other long term (current) drug therapy: Secondary | ICD-10-CM

## 2019-07-04 DIAGNOSIS — Z113 Encounter for screening for infections with a predominantly sexual mode of transmission: Secondary | ICD-10-CM | POA: Diagnosis present

## 2019-07-04 LAB — T-HELPER CELL (CD4) - (RCID CLINIC ONLY)
CD4 % Helper T Cell: 32 % — ABNORMAL LOW (ref 33–65)
CD4 T Cell Abs: 570 /uL (ref 400–1790)

## 2019-07-07 LAB — CBC WITH DIFFERENTIAL/PLATELET
Absolute Monocytes: 466 cells/uL (ref 200–950)
Basophils Absolute: 38 cells/uL (ref 0–200)
Basophils Relative: 0.9 %
Eosinophils Absolute: 298 cells/uL (ref 15–500)
Eosinophils Relative: 7.1 %
HCT: 43.6 % (ref 38.5–50.0)
Hemoglobin: 15 g/dL (ref 13.2–17.1)
Lymphs Abs: 1768 cells/uL (ref 850–3900)
MCH: 31.4 pg (ref 27.0–33.0)
MCHC: 34.4 g/dL (ref 32.0–36.0)
MCV: 91.2 fL (ref 80.0–100.0)
MPV: 11 fL (ref 7.5–12.5)
Monocytes Relative: 11.1 %
Neutro Abs: 1630 cells/uL (ref 1500–7800)
Neutrophils Relative %: 38.8 %
Platelets: 218 10*3/uL (ref 140–400)
RBC: 4.78 10*6/uL (ref 4.20–5.80)
RDW: 12.6 % (ref 11.0–15.0)
Total Lymphocyte: 42.1 %
WBC: 4.2 10*3/uL (ref 3.8–10.8)

## 2019-07-07 LAB — LIPID PANEL
Cholesterol: 200 mg/dL — ABNORMAL HIGH (ref <200)
HDL: 74 mg/dL (ref >=40)
LDL Cholesterol (Calc): 107 mg/dL (calc) — ABNORMAL HIGH
Non-HDL Cholesterol (Calc): 126 mg/dL (calc) (ref <130)
Total CHOL/HDL Ratio: 2.7 (calc) (ref <5.0)
Triglycerides: 101 mg/dL (ref <150)

## 2019-07-07 LAB — RPR: RPR Ser Ql: REACTIVE — AB

## 2019-07-07 LAB — URINE CYTOLOGY ANCILLARY ONLY
Chlamydia: NEGATIVE
Comment: NEGATIVE
Comment: NORMAL
Neisseria Gonorrhea: NEGATIVE

## 2019-07-07 LAB — FLUORESCENT TREPONEMAL AB(FTA)-IGG-BLD: Fluorescent Treponemal ABS: REACTIVE — AB

## 2019-07-07 LAB — HIV-1 RNA QUANT-NO REFLEX-BLD
HIV 1 RNA Quant: <20 copies/mL
HIV-1 RNA Quant, Log: <1.3 Log copies/mL

## 2019-07-07 LAB — RPR TITER: RPR Titer: 1:1 {titer} — ABNORMAL HIGH

## 2019-07-07 LAB — COMPLETE METABOLIC PANEL WITH GFR
AG Ratio: 1.9 (calc) (ref 1.0–2.5)
ALT: 12 U/L (ref 9–46)
AST: 16 U/L (ref 10–40)
Albumin: 4.4 g/dL (ref 3.6–5.1)
Alkaline phosphatase (APISO): 48 U/L (ref 36–130)
BUN: 18 mg/dL (ref 7–25)
CO2: 28 mmol/L (ref 20–32)
Calcium: 9.4 mg/dL (ref 8.6–10.3)
Chloride: 105 mmol/L (ref 98–110)
Creat: 0.96 mg/dL (ref 0.60–1.35)
GFR, Est African American: 111 mL/min/{1.73_m2} (ref >=60)
GFR, Est Non African American: 96 mL/min/{1.73_m2} (ref >=60)
Globulin: 2.3 g/dL (calc) (ref 1.9–3.7)
Glucose, Bld: 62 mg/dL — ABNORMAL LOW (ref 65–99)
Potassium: 3.9 mmol/L (ref 3.5–5.3)
Sodium: 141 mmol/L (ref 135–146)
Total Bilirubin: 0.4 mg/dL (ref 0.2–1.2)
Total Protein: 6.7 g/dL (ref 6.1–8.1)

## 2019-07-10 ENCOUNTER — Telehealth (INDEPENDENT_AMBULATORY_CARE_PROVIDER_SITE_OTHER): Payer: BC Managed Care – PPO | Admitting: Internal Medicine

## 2019-07-10 ENCOUNTER — Encounter: Payer: Self-pay | Admitting: Internal Medicine

## 2019-07-10 ENCOUNTER — Other Ambulatory Visit: Payer: Self-pay

## 2019-07-10 DIAGNOSIS — Z5181 Encounter for therapeutic drug level monitoring: Secondary | ICD-10-CM

## 2019-07-10 DIAGNOSIS — B20 Human immunodeficiency virus [HIV] disease: Secondary | ICD-10-CM | POA: Diagnosis not present

## 2019-07-10 DIAGNOSIS — Z113 Encounter for screening for infections with a predominantly sexual mode of transmission: Secondary | ICD-10-CM | POA: Diagnosis not present

## 2019-07-10 NOTE — Progress Notes (Addendum)
   Subjective:    Patient ID: Zakery Normington, male    DOB: 1975/11/14, 44 y.o.   MRN: [id number]  HPI  I connected with  Roderick Calo on 07/10/2019 by a video enabled telemedicine application and verified that I am speaking with the correct person using two identifiers.   I discussed the limitations, risks, security and privacy concerns of performing an evaluation and management service by telephone and the availability of in person appointments. I also discussed with the patient that there may be a patient responsible charge related to this service. The patient expressed understanding and agreed to proceed.  Location:  Patient: Home Provider: Clinic  Mr. Dolley is doing well with his Genvoya and has no issues with compliance or tolerance of the medication.  His labs confirm this with a CD4 of 570 and viral load < 20.  Creat, LFTs wnl.  His only complaint is stress from work and he is going to reach out to his PCP to get scheduled with a Veterinary surgeon.  He knows he can reach out to [place] for our counselor if needed as well.     Review of Systems  Constitutional: Negative for unexpected weight change.  Gastrointestinal: Negative for diarrhea.  Skin: Negative for rash.       Objective:   Physical Exam Eyes:     General: No scleral icterus. Pulmonary:     Effort: Pulmonary effort is normal.  Neurological:     General: No focal deficit present.     Mental Status: He is alert.  Psychiatric:        Mood and Affect: Mood normal.   SH: no tobacco       Assessment & Plan:

## 2019-07-10 NOTE — Assessment & Plan Note (Signed)
Creat, LFTs wnl.  

## 2019-07-10 NOTE — Assessment & Plan Note (Signed)
Screened negative 

## 2019-07-10 NOTE — Assessment & Plan Note (Signed)
He continues to do well with Genvoya and no new issues.  Will provide revffills.  rtc in 6 months.

## 2019-07-10 NOTE — Progress Notes (Signed)
Send patient a MyChart message regarding follow up appointment date/time in 6 months. Hector Armstrong

## 2019-07-23 NOTE — Progress Notes (Signed)
done

## 2019-09-11 ENCOUNTER — Other Ambulatory Visit: Payer: Self-pay

## 2019-09-11 ENCOUNTER — Other Ambulatory Visit: Payer: Self-pay | Admitting: Pharmacist

## 2019-09-11 DIAGNOSIS — B20 Human immunodeficiency virus [HIV] disease: Secondary | ICD-10-CM

## 2019-09-11 MED ORDER — GENVOYA 150-150-200-10 MG PO TABS: 1.0000 | ORAL_TABLET | Freq: Every day | ORAL | 3 refills | Status: DC

## 2019-09-17 ENCOUNTER — Telehealth: Payer: Self-pay | Admitting: Pharmacy Technician

## 2019-09-17 MED FILL — GENVOYA TABLET: 150-150-200 | 30 days supply | Qty: 30 | Fill #0

## 2019-09-17 NOTE — Telephone Encounter (Addendum)
[facility] Patient Advocate Encounter  Completed and sent [employer] Adv[employer] Access application for Genvoya for this patient who is uninsured.    Patient is approved through 09/16/2020.    Hector Armstrong. Dimas Aguas CPhT Specialty Pharmacy Patient [facility] for Infectious Disease Phone: [phone] Fax:  [phone]

## 2019-10-15 MED FILL — GENVOYA TABLET: 150-150-200 | 30 days supply | Qty: 30 | Fill #1

## 2019-11-10 MED FILL — GENVOYA TABLET: 150-150-200 | 30 days supply | Qty: 30 | Fill #2

## 2019-12-17 ENCOUNTER — Other Ambulatory Visit: Payer: BC Managed Care – PPO

## 2019-12-18 MED FILL — GENVOYA TABLET: 150-150-200 | 30 days supply | Qty: 30 | Fill #3

## 2019-12-19 ENCOUNTER — Other Ambulatory Visit: Payer: Self-pay | Admitting: Family

## 2019-12-19 DIAGNOSIS — Z113 Encounter for screening for infections with a predominantly sexual mode of transmission: Secondary | ICD-10-CM

## 2019-12-31 ENCOUNTER — Telehealth: Payer: BC Managed Care – PPO | Admitting: Internal Medicine

## 2020-01-23 ENCOUNTER — Other Ambulatory Visit: Payer: Self-pay | Admitting: Internal Medicine

## 2020-01-23 DIAGNOSIS — B20 Human immunodeficiency virus [HIV] disease: Secondary | ICD-10-CM

## 2020-01-23 MED FILL — GENVOYA TABLET: 150-150-200 | 30 days supply | Qty: 30 | Fill #0

## 2020-02-02 ENCOUNTER — Telehealth (INDEPENDENT_AMBULATORY_CARE_PROVIDER_SITE_OTHER): Payer: BC Managed Care – PPO | Admitting: Internal Medicine

## 2020-02-02 ENCOUNTER — Other Ambulatory Visit: Payer: Self-pay

## 2020-02-02 DIAGNOSIS — B20 Human immunodeficiency virus [HIV] disease: Secondary | ICD-10-CM

## 2020-02-05 NOTE — Progress Notes (Signed)
No show

## 2020-03-02 ENCOUNTER — Other Ambulatory Visit: Payer: Self-pay | Admitting: Internal Medicine

## 2020-03-02 ENCOUNTER — Telehealth: Payer: Self-pay

## 2020-03-02 DIAGNOSIS — B20 Human immunodeficiency virus [HIV] disease: Secondary | ICD-10-CM

## 2020-03-02 MED FILL — GENVOYA TABLET: 150-150-200 | 30 days supply | Qty: 30 | Fill #0

## 2020-03-02 NOTE — Telephone Encounter (Signed)
Received call from patient requesting that lab orders be sent to his PCP in [facility] so that he can get labs done there and follow up with Dr. Luciana Axe via virtual visit. RN updated patient PCP information in chart. Patient scheduled for virtual 03/29/20.   RN spoke to [facility] PCP for fax number [phone]).   Sandie Ano, RN

## 2020-03-06 NOTE — Telephone Encounter (Signed)
Yes, that will be fine.  Thanks.

## 2020-03-29 ENCOUNTER — Encounter: Payer: Self-pay | Admitting: Internal Medicine

## 2020-03-29 ENCOUNTER — Telehealth (INDEPENDENT_AMBULATORY_CARE_PROVIDER_SITE_OTHER): Payer: BC Managed Care – PPO | Admitting: Internal Medicine

## 2020-03-29 ENCOUNTER — Other Ambulatory Visit: Payer: Self-pay

## 2020-03-29 DIAGNOSIS — B20 Human immunodeficiency virus [HIV] disease: Secondary | ICD-10-CM | POA: Diagnosis not present

## 2020-03-29 DIAGNOSIS — Z8619 Personal history of other infectious and parasitic diseases: Secondary | ICD-10-CM | POA: Diagnosis not present

## 2020-03-29 NOTE — Assessment & Plan Note (Signed)
He does well with HIV and no concerns today.  No CD4 count or viral load so he will come in here to have labs done next month.  All other labs reviewed in Care Everywhere.   He will rtc in 6 months and will consider yearly visits after that.

## 2020-03-29 NOTE — Progress Notes (Signed)
   Subjective:    Patient ID: Hector Armstrong, male    DOB: [birth year], 45 y.o.   MRN: [id number]  HPI I connected with  Hector Armstrong on 03/29/20 by phone and verified that I am speaking with the correct person using two identifiers.   I discussed the limitations of evaluation and management by telemedicine. The patient expressed understanding and agreed to proceed.  Location: Patient - home Physician - clinic  Duration of visit: 21 minutes  Here for follow up of HIV He continues on Genvoya with no missed doses.  He is doing well with no complaints.  He had labs done at his PCPs office and labs reviewed.  CMP, CBC noted and no abnormalities.  No CD4 count or HIV RNA viral load was done.  Lipid panel also noted.  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  He has no complaints today.  He has no issues getting or tolerating his medication.    Review of Systems  Constitutional: Negative for fatigue.  Gastrointestinal: Negative for diarrhea and nausea.  Skin: Negative for rash.       Objective:   Physical Exam Neurological:     Mental Status: He is alert.  Psychiatric:        Mood and Affect: Mood normal.           Assessment & Plan:

## 2020-03-29 NOTE — Assessment & Plan Note (Addendum)
His titer is 1:1 and he previously has been treated and no new concerns.  This is not consistent with a new infection, though regardless he has been treated again by his PCP.

## 2020-04-09 ENCOUNTER — Other Ambulatory Visit: Payer: Self-pay | Admitting: Internal Medicine

## 2020-04-09 DIAGNOSIS — B20 Human immunodeficiency virus [HIV] disease: Secondary | ICD-10-CM

## 2020-04-09 MED FILL — GENVOYA TABLET: 150-150-200 | 30 days supply | Qty: 30 | Fill #0

## 2020-04-13 ENCOUNTER — Other Ambulatory Visit (HOSPITAL_COMMUNITY): Payer: Self-pay

## 2020-04-19 ENCOUNTER — Other Ambulatory Visit (HOSPITAL_COMMUNITY): Payer: Self-pay

## 2020-04-19 ENCOUNTER — Telehealth (HOSPITAL_COMMUNITY): Payer: Self-pay

## 2020-04-19 MED FILL — Elvitegrav-Cobic-Emtricitab-Tenofov AF Tab 150-150-200-10 MG: ORAL | 30 days supply | Qty: 30 | Fill #0 | Status: AC

## 2020-04-19 NOTE — Telephone Encounter (Signed)
[facility] Patient Advocate Encounter   I was successful in securing patient a $7500 grant from [employer] (PAF) to provide copayment coverage for Genvoya.  This will make the out of pocket cost $0.00.     I have spoken with the patient.    The billing information is as follows and has been shared with [facility].        Patient knows to call the office with questions or concerns.  Clearance Coots, CPhT Specialty Pharmacy Patient [facility] for Infectious Disease Phone: [phone] Fax:  [phone]

## 2020-05-07 ENCOUNTER — Other Ambulatory Visit (HOSPITAL_COMMUNITY): Payer: Self-pay

## 2020-05-10 ENCOUNTER — Other Ambulatory Visit: Payer: BC Managed Care – PPO

## 2020-05-10 ENCOUNTER — Other Ambulatory Visit (HOSPITAL_COMMUNITY): Payer: Self-pay

## 2020-05-10 ENCOUNTER — Other Ambulatory Visit: Payer: Self-pay

## 2020-05-10 DIAGNOSIS — B20 Human immunodeficiency virus [HIV] disease: Secondary | ICD-10-CM

## 2020-05-11 LAB — T-HELPER CELL (CD4) - (RCID CLINIC ONLY)
CD4 % Helper T Cell: 30 % — ABNORMAL LOW (ref 33–65)
CD4 T Cell Abs: 633 /uL (ref 400–1790)

## 2020-05-12 LAB — HIV-1 RNA QUANT-NO REFLEX-BLD
HIV 1 RNA Quant: NOT DETECTED Copies/mL
HIV-1 RNA Quant, Log: NOT DETECTED Log cps/mL

## 2020-05-13 ENCOUNTER — Other Ambulatory Visit (HOSPITAL_COMMUNITY): Payer: Self-pay

## 2020-05-17 ENCOUNTER — Telehealth: Payer: Self-pay

## 2020-05-17 NOTE — Telephone Encounter (Signed)
Patient's wife called to get lab results from recent lab visit for life insurance. Contacted patient to confirm ok to release results to her. He gave verbal consent. Relayed results to her. She will get access to his Mychart as we don't have access to his password information.   Kelly Ranieri Loyola Mast, RN

## 2020-05-18 NOTE — Telephone Encounter (Signed)
Patient unable to access mychart still. RN deactivated account, sent reactivation code to his cell phone. Patient's wife calling for update on insurance provider's record request. RN gave her [facility]'s number to see if they are processing it. [phone]. Andree Coss, RN

## 2020-05-20 NOTE — Telephone Encounter (Signed)
Wife calls and states she has tried calling HIM phone number provided and has left several voicemail with no return call. Family also provided with  HIM e-mail address and fax number for HIM as well. Family appreciative of infomarion. Hector Armstrong

## 2020-06-10 IMAGING — CR DG HAND COMPLETE 3+V*R*
3 series · 3 of 3 positions shown · non-contrast
Comparison: None.

CLINICAL DATA: Soft tissue swelling of the right hand for 1 month.

EXAM:
RIGHT HAND - COMPLETE 3+ VIEW

[x hand pa right]
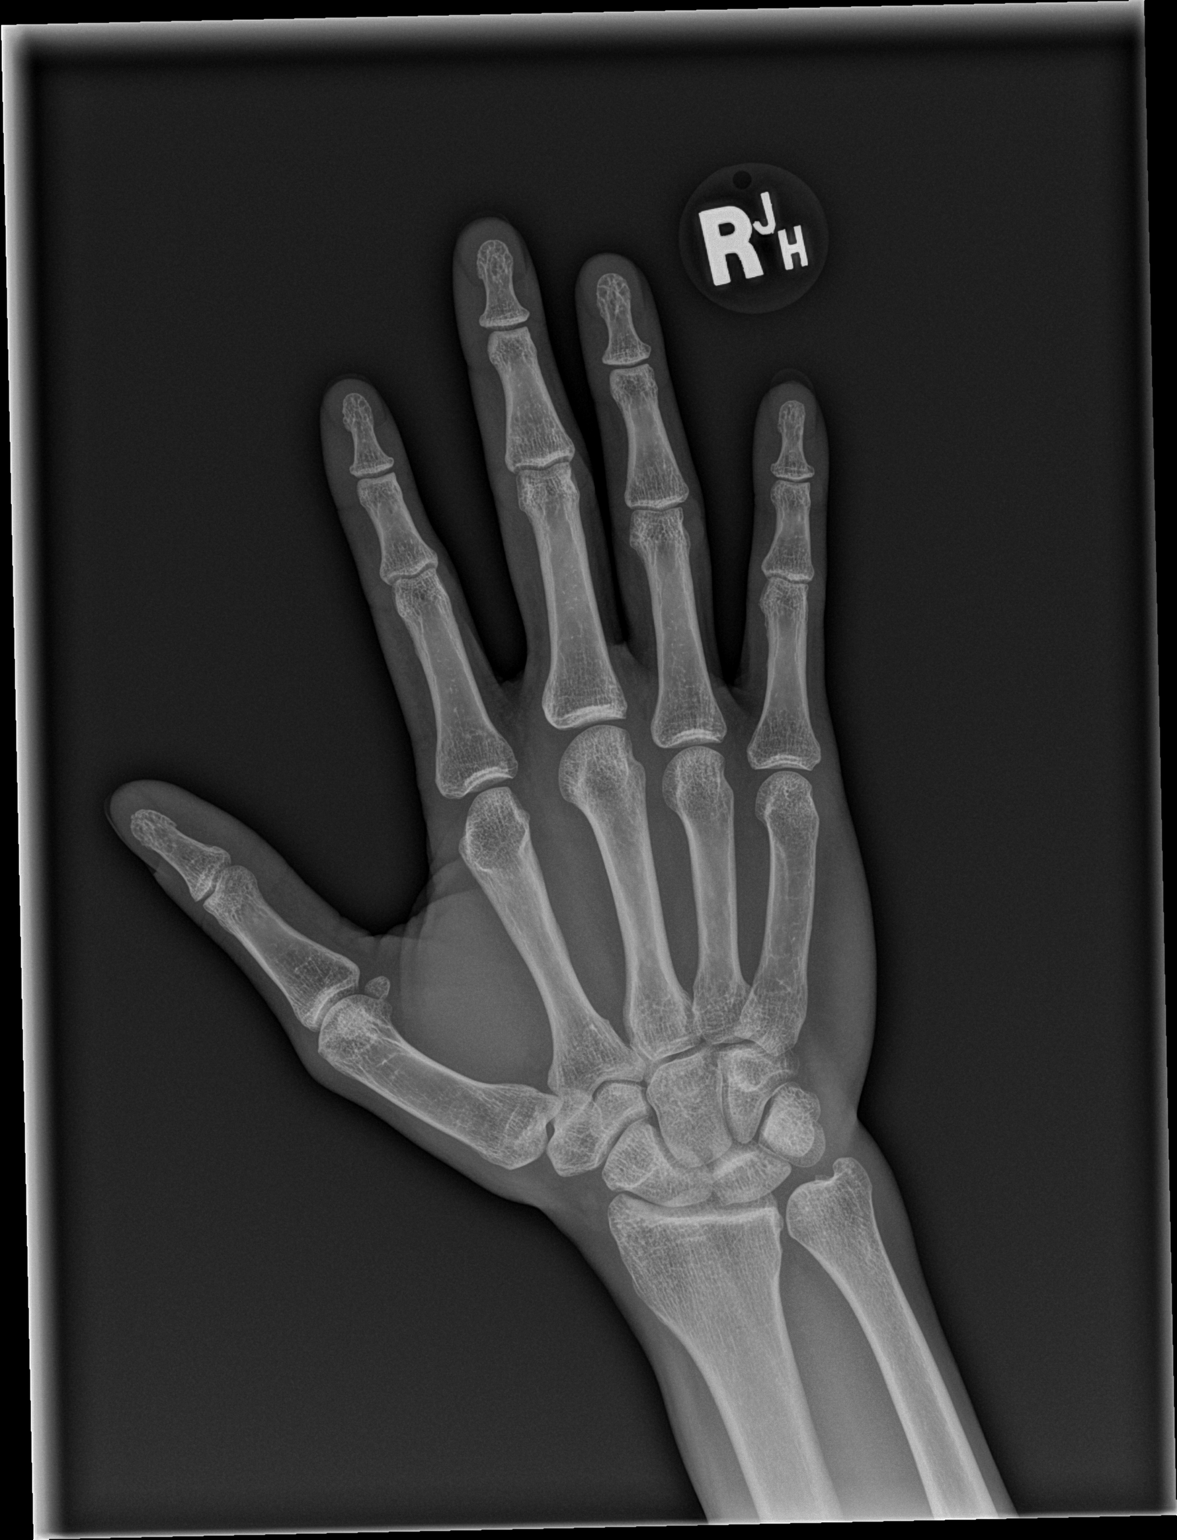

[x hand obl right]
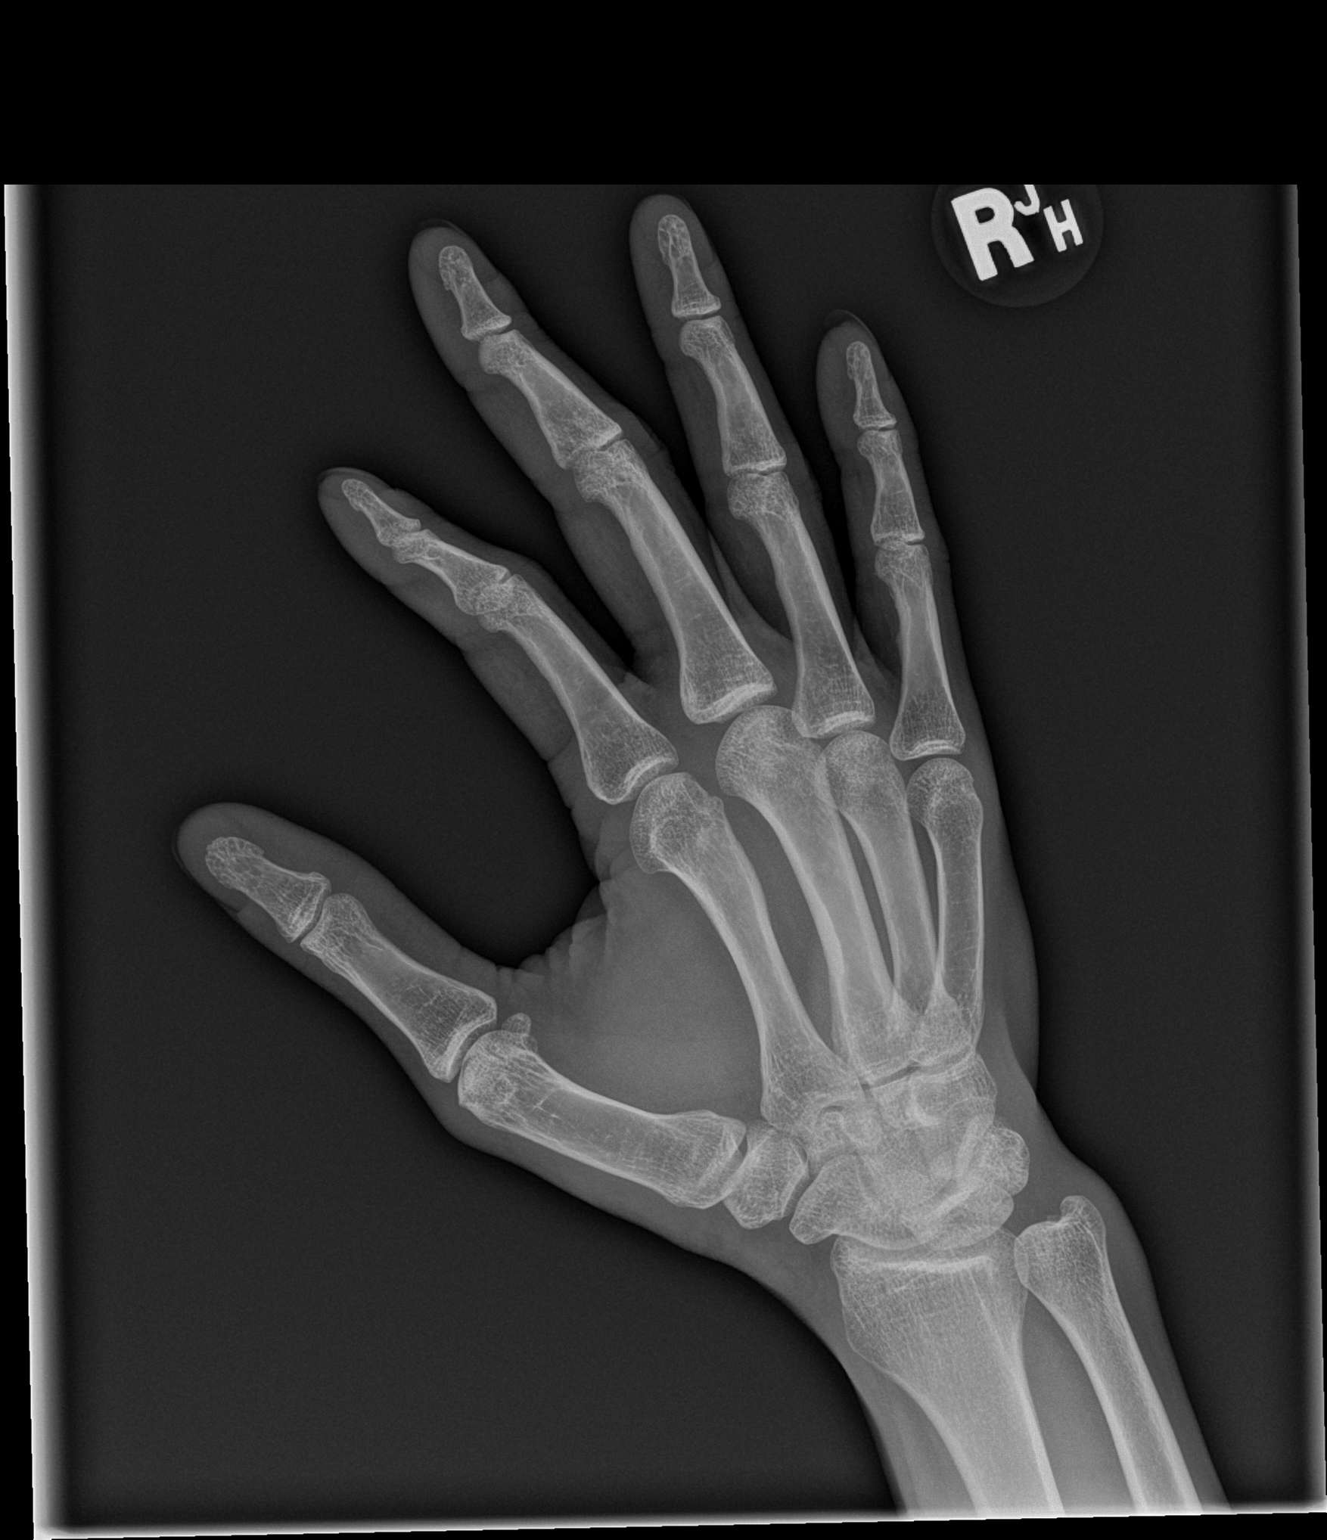

[x hand lat right]
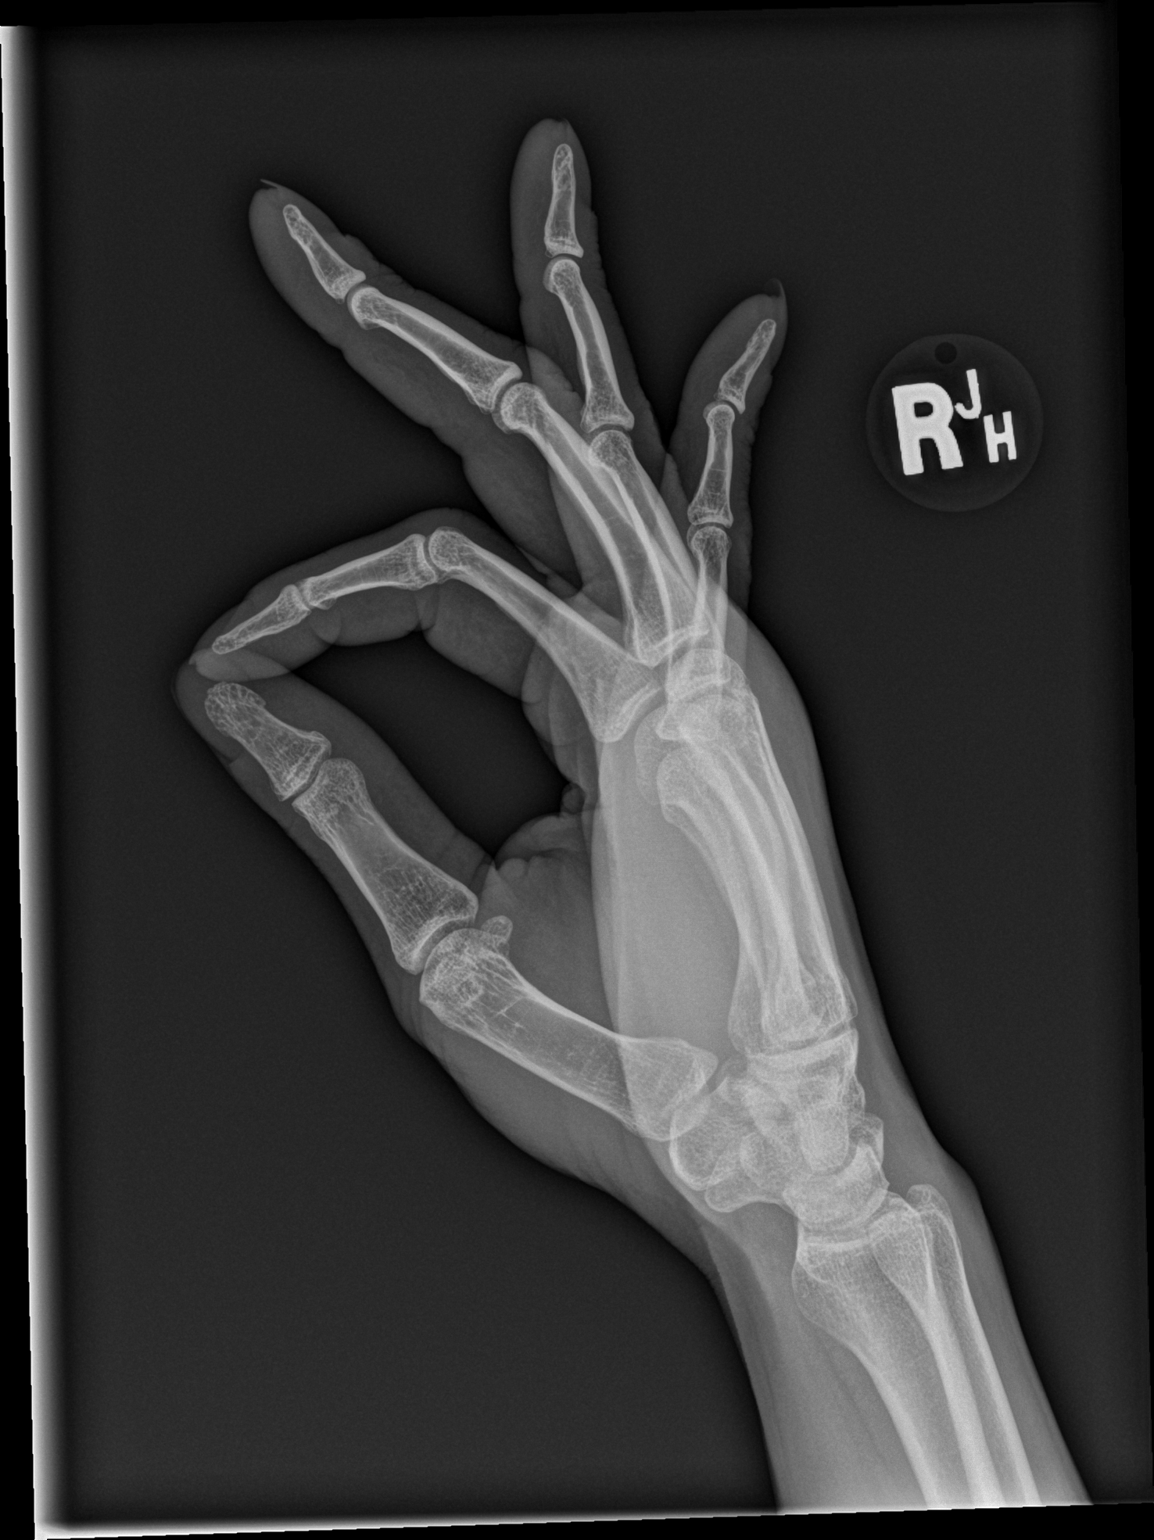

[3 of 3 positions shown; findings below may reference images not displayed]

FINDINGS: The bones of the right hand appear normal. No joint space narrowing
or arthritic changes.

Slight soft tissue swelling of the dorsum of the hand at the level
of the distal metacarpals.
IMPRESSION: Nonspecific dorsal soft tissue swelling.  Otherwise, normal exam.

## 2020-08-02 ENCOUNTER — Other Ambulatory Visit: Payer: Self-pay | Admitting: Internal Medicine

## 2020-08-02 ENCOUNTER — Other Ambulatory Visit (HOSPITAL_COMMUNITY): Payer: Self-pay

## 2020-08-02 DIAGNOSIS — B20 Human immunodeficiency virus [HIV] disease: Secondary | ICD-10-CM

## 2020-08-02 MED ORDER — GENVOYA 150-150-200-10 MG PO TABS
1.0000 | ORAL_TABLET | Freq: Every day | ORAL | 5 refills | Status: DC
  Filled 2020-08-02: qty 30, 30d supply, fill #0
  Filled 2020-09-03 – 2020-11-01 (×4): qty 30, 30d supply, fill #1

## 2020-08-03 ENCOUNTER — Other Ambulatory Visit (HOSPITAL_COMMUNITY): Payer: Self-pay

## 2020-08-26 ENCOUNTER — Other Ambulatory Visit (HOSPITAL_COMMUNITY): Payer: Self-pay

## 2020-09-01 ENCOUNTER — Other Ambulatory Visit (HOSPITAL_COMMUNITY): Payer: Self-pay

## 2020-09-03 ENCOUNTER — Other Ambulatory Visit (HOSPITAL_COMMUNITY): Payer: Self-pay

## 2020-09-29 ENCOUNTER — Ambulatory Visit: Payer: BC Managed Care – PPO | Admitting: Internal Medicine

## 2020-09-29 ENCOUNTER — Other Ambulatory Visit (HOSPITAL_COMMUNITY): Payer: Self-pay

## 2020-10-04 ENCOUNTER — Other Ambulatory Visit (HOSPITAL_COMMUNITY): Payer: Self-pay

## 2020-10-06 ENCOUNTER — Other Ambulatory Visit (HOSPITAL_COMMUNITY): Payer: Self-pay

## 2020-10-29 ENCOUNTER — Other Ambulatory Visit (HOSPITAL_COMMUNITY): Payer: Self-pay

## 2020-11-01 ENCOUNTER — Other Ambulatory Visit (HOSPITAL_COMMUNITY): Payer: Self-pay

## 2020-11-01 ENCOUNTER — Other Ambulatory Visit: Payer: Self-pay | Admitting: Pharmacist

## 2020-11-01 DIAGNOSIS — B20 Human immunodeficiency virus [HIV] disease: Secondary | ICD-10-CM

## 2020-11-01 MED ORDER — DOVATO 50-300 MG PO TABS
1.0000 | ORAL_TABLET | Freq: Every day | ORAL | 5 refills | Status: DC
  Filled 2020-11-01 (×2): qty 30, 30d supply, fill #0
  Filled 2020-11-26: qty 30, 30d supply, fill #1
  Filled 2020-12-23: qty 30, 30d supply, fill #2
  Filled 2021-02-14: qty 30, 30d supply, fill #3
  Filled 2021-03-25: qty 30, 30d supply, fill #4
  Filled 2021-04-19: qty 30, 30d supply, fill #5

## 2020-11-26 ENCOUNTER — Other Ambulatory Visit (HOSPITAL_COMMUNITY): Payer: Self-pay

## 2020-11-30 ENCOUNTER — Other Ambulatory Visit (HOSPITAL_COMMUNITY): Payer: Self-pay

## 2020-12-21 ENCOUNTER — Other Ambulatory Visit (HOSPITAL_COMMUNITY): Payer: Self-pay

## 2020-12-23 ENCOUNTER — Other Ambulatory Visit (HOSPITAL_COMMUNITY): Payer: Self-pay

## 2021-01-17 ENCOUNTER — Other Ambulatory Visit (HOSPITAL_COMMUNITY): Payer: Self-pay

## 2021-01-20 ENCOUNTER — Other Ambulatory Visit (HOSPITAL_COMMUNITY): Payer: Self-pay

## 2021-01-24 ENCOUNTER — Other Ambulatory Visit (HOSPITAL_COMMUNITY): Payer: Self-pay

## 2021-02-14 ENCOUNTER — Other Ambulatory Visit (HOSPITAL_COMMUNITY): Payer: Self-pay

## 2021-02-15 ENCOUNTER — Other Ambulatory Visit (HOSPITAL_COMMUNITY): Payer: Self-pay

## 2021-03-14 ENCOUNTER — Other Ambulatory Visit (HOSPITAL_COMMUNITY): Payer: Self-pay

## 2021-03-16 ENCOUNTER — Other Ambulatory Visit (HOSPITAL_COMMUNITY): Payer: Self-pay

## 2021-03-25 ENCOUNTER — Other Ambulatory Visit (HOSPITAL_COMMUNITY): Payer: Self-pay

## 2021-04-19 ENCOUNTER — Other Ambulatory Visit (HOSPITAL_COMMUNITY): Payer: Self-pay

## 2021-04-20 ENCOUNTER — Other Ambulatory Visit (HOSPITAL_COMMUNITY): Payer: Self-pay

## 2021-05-06 ENCOUNTER — Other Ambulatory Visit (HOSPITAL_COMMUNITY)
Admission: RE | Admit: 2021-05-06 | Discharge: 2021-05-06 | Disposition: A | Discharge status: Home / Self Care | Payer: BC Managed Care – PPO | Source: Ambulatory Visit | Attending: Internal Medicine | Admitting: Internal Medicine

## 2021-05-06 ENCOUNTER — Ambulatory Visit: Payer: BC Managed Care – PPO | Admitting: Internal Medicine

## 2021-05-06 ENCOUNTER — Encounter: Payer: Self-pay | Admitting: Internal Medicine

## 2021-05-06 ENCOUNTER — Telehealth: Payer: Self-pay

## 2021-05-06 ENCOUNTER — Ambulatory Visit (INDEPENDENT_AMBULATORY_CARE_PROVIDER_SITE_OTHER): Payer: BC Managed Care – PPO | Admitting: Internal Medicine

## 2021-05-06 ENCOUNTER — Other Ambulatory Visit: Payer: Self-pay

## 2021-05-06 VITALS — BP 149/101 | HR 72 | Temp 97.7°F | Ht 71.0 in | Wt 139.0 lb

## 2021-05-06 DIAGNOSIS — Z113 Encounter for screening for infections with a predominantly sexual mode of transmission: Secondary | ICD-10-CM

## 2021-05-06 DIAGNOSIS — Z79899 Other long term (current) drug therapy: Secondary | ICD-10-CM

## 2021-05-06 DIAGNOSIS — B20 Human immunodeficiency virus [HIV] disease: Secondary | ICD-10-CM

## 2021-05-06 DIAGNOSIS — Z5181 Encounter for therapeutic drug level monitoring: Secondary | ICD-10-CM

## 2021-05-06 MED ORDER — CLOTRIMAZOLE 1 % EX CREA: 1.0000 "application " | TOPICAL_CREAM | Freq: Two times a day (BID) | CUTANEOUS | 0 refills | Status: DC

## 2021-05-06 NOTE — Telephone Encounter (Signed)
Called patient to reschedule missed appointment with Dr. Luciana Axe this morning. Patient stated he attempted to call office multiple times and was unable to get through to reschedule. Per patient request, rescheduled to 8:45 am on 05/25/21.  ? ?Wyvonne Lenz, RN  ?

## 2021-05-06 NOTE — Assessment & Plan Note (Signed)
Lipid panel today

## 2021-05-06 NOTE — Assessment & Plan Note (Signed)
He is doing well on Dovato since the change and no indication for any changes.  Will check his labs today and if ok, he can rtc in 6 months.  ?

## 2021-05-06 NOTE — Progress Notes (Signed)
? ?  Subjective:  ? ? Patient ID: Hector Armstrong, male    DOB: 08/05/1975, 46 y.o.   MRN: [id number] ? ?HPI ?Here for follow up of HIV ?He is on Dovato now after changing from [place] due to insurance considerations and he continues to do well.  He has had no issues since last year.  No labs prior to the visit. No issues with getting, taking or tolerating the medication.  Recently separated from his wife and 4 adopted daughters.  ? ? ?Review of Systems  ?Constitutional:  Negative for fatigue.  ?Gastrointestinal:  Negative for diarrhea and nausea.  ?Skin:  Negative for rash.  ? ?   ?Objective:  ? Physical Exam ?Eyes:  ?   General: No scleral icterus. ?Pulmonary:  ?   Effort: Pulmonary effort is normal.  ?Skin: ?   Findings: No rash.  ?Neurological:  ?   General: No focal deficit present.  ?   Mental Status: He is alert.  ?Psychiatric:     ?   Mood and Affect: Mood normal.  ? ? ?SH; no tobacco ? ? ?   ?Assessment & Plan:  ? ? ?

## 2021-05-06 NOTE — Assessment & Plan Note (Signed)
Will screen today 

## 2021-05-09 LAB — URINE CYTOLOGY ANCILLARY ONLY
Chlamydia: NEGATIVE
Comment: NEGATIVE
Comment: NORMAL
Neisseria Gonorrhea: NEGATIVE

## 2021-05-11 LAB — CBC WITH DIFFERENTIAL/PLATELET
Absolute Monocytes: 707 cells/uL (ref 200–950)
Basophils Absolute: 68 cells/uL (ref 0–200)
Basophils Relative: 1.2 %
Eosinophils Absolute: 279 cells/uL (ref 15–500)
Eosinophils Relative: 4.9 %
HCT: 45 % (ref 38.5–50.0)
Hemoglobin: 15.8 g/dL (ref 13.2–17.1)
Lymphs Abs: 2149 cells/uL (ref 850–3900)
MCH: 32.3 pg (ref 27.0–33.0)
MCHC: 35.1 g/dL (ref 32.0–36.0)
MCV: 92 fL (ref 80.0–100.0)
MPV: 10.6 fL (ref 7.5–12.5)
Monocytes Relative: 12.4 %
Neutro Abs: 2497 cells/uL (ref 1500–7800)
Neutrophils Relative %: 43.8 %
Platelets: 257 10*3/uL (ref 140–400)
RBC: 4.89 10*6/uL (ref 4.20–5.80)
RDW: 12.6 % (ref 11.0–15.0)
Total Lymphocyte: 37.7 %
WBC: 5.7 10*3/uL (ref 3.8–10.8)

## 2021-05-11 LAB — HIV-1 RNA QUANT-NO REFLEX-BLD
HIV 1 RNA Quant: NOT DETECTED copies/mL
HIV-1 RNA Quant, Log: NOT DETECTED Log copies/mL

## 2021-05-11 LAB — COMPLETE METABOLIC PANEL WITH GFR
AG Ratio: 1.6 (calc) (ref 1.0–2.5)
ALT: 19 U/L (ref 9–46)
AST: 21 U/L (ref 10–40)
Albumin: 4.5 g/dL (ref 3.6–5.1)
Alkaline phosphatase (APISO): 63 U/L (ref 36–130)
BUN/Creatinine Ratio: 13 (calc) (ref 6–22)
BUN: 17 mg/dL (ref 7–25)
CO2: 29 mmol/L (ref 20–32)
Calcium: 9.6 mg/dL (ref 8.6–10.3)
Chloride: 106 mmol/L (ref 98–110)
Creat: 1.33 mg/dL — ABNORMAL HIGH (ref 0.60–1.29)
Globulin: 2.8 g/dL (calc) (ref 1.9–3.7)
Glucose, Bld: 91 mg/dL (ref 65–99)
Potassium: 4.2 mmol/L (ref 3.5–5.3)
Sodium: 141 mmol/L (ref 135–146)
Total Bilirubin: 0.5 mg/dL (ref 0.2–1.2)
Total Protein: 7.3 g/dL (ref 6.1–8.1)
eGFR: 67 mL/min/{1.73_m2} (ref >=60)

## 2021-05-11 LAB — LIPID PANEL
Cholesterol: 188 mg/dL (ref <200)
HDL: 84 mg/dL (ref >=40)
LDL Cholesterol (Calc): 89 mg/dL (calc)
Non-HDL Cholesterol (Calc): 104 mg/dL (calc) (ref <130)
Total CHOL/HDL Ratio: 2.2 (calc) (ref <5.0)
Triglycerides: 63 mg/dL (ref <150)

## 2021-05-11 LAB — T-HELPER CELLS (CD4) COUNT (NOT AT ARMC)
Absolute CD4: 638 cells/uL (ref 490–1740)
CD4 T Helper %: 30 % (ref 30–61)
Total lymphocyte count: 2143 cells/uL (ref 850–3900)

## 2021-05-11 LAB — RPR: RPR Ser Ql: NONREACTIVE

## 2021-05-12 ENCOUNTER — Other Ambulatory Visit (HOSPITAL_COMMUNITY): Payer: Self-pay

## 2021-05-12 ENCOUNTER — Other Ambulatory Visit: Payer: Self-pay | Admitting: Pharmacist

## 2021-05-12 ENCOUNTER — Telehealth: Payer: Self-pay

## 2021-05-12 DIAGNOSIS — B20 Human immunodeficiency virus [HIV] disease: Secondary | ICD-10-CM

## 2021-05-12 MED ORDER — DOVATO 50-300 MG PO TABS
1.0000 | ORAL_TABLET | Freq: Every day | ORAL | 5 refills | Status: DC
  Filled 2021-05-12: qty 30, 30d supply, fill #0
  Filled 2021-06-09: qty 30, 30d supply, fill #1
  Filled 2021-07-04: qty 30, 30d supply, fill #2
  Filled 2021-08-01: qty 30, 30d supply, fill #3
  Filled 2021-08-25: qty 30, 30d supply, fill #4
  Filled 2021-10-03: qty 30, 30d supply, fill #5

## 2021-05-12 MED ORDER — CLOTRIMAZOLE 1 % EX CREA
1.0000 "application " | TOPICAL_CREAM | Freq: Two times a day (BID) | CUTANEOUS | 0 refills | Status: DC
  Filled 2021-05-12: qty 30, 15d supply, fill #0

## 2021-05-12 NOTE — Telephone Encounter (Signed)
Patient called unable to get his clotrimazole. Rx was sent to [facility] pharmacy, patient uses [facility]. Resent to patient's preferred pharmacy. [facility] does not have account for patient or any Rx on file. Provided patient with phone number for [facility] to request delivery.  ? ?Sandie Ano, RN ? ?

## 2021-05-16 ENCOUNTER — Other Ambulatory Visit (HOSPITAL_COMMUNITY): Payer: Self-pay

## 2021-05-25 ENCOUNTER — Ambulatory Visit: Payer: BC Managed Care – PPO | Admitting: Internal Medicine

## 2021-06-09 ENCOUNTER — Other Ambulatory Visit (HOSPITAL_COMMUNITY): Payer: Self-pay

## 2021-06-10 ENCOUNTER — Other Ambulatory Visit (HOSPITAL_COMMUNITY): Payer: Self-pay

## 2021-07-04 ENCOUNTER — Other Ambulatory Visit (HOSPITAL_COMMUNITY): Payer: Self-pay

## 2021-07-04 ENCOUNTER — Telehealth: Payer: Self-pay

## 2021-07-04 NOTE — Telephone Encounter (Signed)
Patient called requesting lab results from 4/21. Patient also requesting a referral to [facility] on [address].    Jamon Hayhurst Lesli Albee, CMA

## 2021-07-04 NOTE — Telephone Encounter (Signed)
Patient aware of results. Patient aware doesn't need a referral for PCP.    Cherrell Maybee Lesli Albee, CMA

## 2021-07-06 ENCOUNTER — Other Ambulatory Visit (HOSPITAL_COMMUNITY): Payer: Self-pay

## 2021-08-01 ENCOUNTER — Other Ambulatory Visit (HOSPITAL_COMMUNITY): Payer: Self-pay

## 2021-08-04 ENCOUNTER — Other Ambulatory Visit (HOSPITAL_COMMUNITY): Payer: Self-pay

## 2021-08-25 ENCOUNTER — Other Ambulatory Visit (HOSPITAL_COMMUNITY): Payer: Self-pay

## 2021-09-02 ENCOUNTER — Other Ambulatory Visit (HOSPITAL_COMMUNITY): Payer: Self-pay

## 2021-09-26 ENCOUNTER — Other Ambulatory Visit (HOSPITAL_COMMUNITY): Payer: Self-pay

## 2021-09-29 ENCOUNTER — Other Ambulatory Visit (HOSPITAL_COMMUNITY): Payer: Self-pay

## 2021-10-03 ENCOUNTER — Other Ambulatory Visit (HOSPITAL_COMMUNITY): Payer: Self-pay

## 2021-10-06 ENCOUNTER — Other Ambulatory Visit (HOSPITAL_COMMUNITY): Payer: Self-pay

## 2021-10-28 ENCOUNTER — Other Ambulatory Visit (HOSPITAL_COMMUNITY): Payer: Self-pay

## 2021-10-31 ENCOUNTER — Other Ambulatory Visit: Payer: Self-pay | Admitting: Internal Medicine

## 2021-10-31 ENCOUNTER — Other Ambulatory Visit (HOSPITAL_COMMUNITY): Payer: Self-pay

## 2021-10-31 DIAGNOSIS — B20 Human immunodeficiency virus [HIV] disease: Secondary | ICD-10-CM

## 2021-10-31 MED ORDER — DOVATO 50-300 MG PO TABS
1.0000 | ORAL_TABLET | Freq: Every day | ORAL | 0 refills | Status: DC
  Filled 2021-10-31 – 2021-12-07 (×4): qty 30, 30d supply, fill #0

## 2021-11-04 ENCOUNTER — Other Ambulatory Visit (HOSPITAL_COMMUNITY): Payer: Self-pay

## 2021-11-08 ENCOUNTER — Ambulatory Visit (INDEPENDENT_AMBULATORY_CARE_PROVIDER_SITE_OTHER): Payer: BC Managed Care – PPO | Admitting: Internal Medicine

## 2021-11-08 ENCOUNTER — Encounter: Payer: Self-pay | Admitting: Internal Medicine

## 2021-11-08 ENCOUNTER — Ambulatory Visit (INDEPENDENT_AMBULATORY_CARE_PROVIDER_SITE_OTHER): Payer: BC Managed Care – PPO

## 2021-11-08 ENCOUNTER — Other Ambulatory Visit: Payer: Self-pay

## 2021-11-08 ENCOUNTER — Other Ambulatory Visit (HOSPITAL_COMMUNITY): Payer: Self-pay

## 2021-11-08 VITALS — BP 154/108 | HR 86 | Temp 97.5°F | Ht 71.0 in | Wt 139.0 lb

## 2021-11-08 DIAGNOSIS — Z5181 Encounter for therapeutic drug level monitoring: Secondary | ICD-10-CM | POA: Diagnosis not present

## 2021-11-08 DIAGNOSIS — Z113 Encounter for screening for infections with a predominantly sexual mode of transmission: Secondary | ICD-10-CM | POA: Diagnosis not present

## 2021-11-08 DIAGNOSIS — Z79899 Other long term (current) drug therapy: Secondary | ICD-10-CM | POA: Diagnosis not present

## 2021-11-08 DIAGNOSIS — Z23 Encounter for immunization: Secondary | ICD-10-CM

## 2021-11-08 DIAGNOSIS — B20 Human immunodeficiency virus [HIV] disease: Secondary | ICD-10-CM

## 2021-11-08 MED ORDER — ROSUVASTATIN CALCIUM 10 MG PO TABS: 10.0000 mg | ORAL_TABLET | Freq: Every day | ORAL | 11 refills | Status: DC

## 2021-11-08 MED ORDER — ROSUVASTATIN CALCIUM 10 MG PO TABS
10.0000 mg | ORAL_TABLET | Freq: Every day | ORAL | 11 refills | Status: DC
  Filled 2021-11-08: qty 30, 30d supply, fill #0

## 2021-11-08 NOTE — Assessment & Plan Note (Signed)
Will screen, low risk 

## 2021-11-08 NOTE — Progress Notes (Signed)
   Subjective:    Patient ID: Hector Armstrong, male    DOB: Jul 16, 1975, 46 y.o.   MRN: [id number]  HPI Here for follow up of HIV He continues on Dovato and denies any missed doses.  Had run out of refills.  No concerns today otherwise.     Review of Systems  Constitutional:  Negative for fatigue.  Gastrointestinal:  Negative for diarrhea.  Skin:  Negative for rash.       Objective:   Physical Exam Eyes:     General: No scleral icterus. Pulmonary:     Effort: Pulmonary effort is normal.  Neurological:     General: No focal deficit present.     Mental Status: He is alert.  Psychiatric:        Mood and Affect: Mood normal.   SH: no tobacco        Assessment & Plan:

## 2021-11-08 NOTE — Assessment & Plan Note (Signed)
Will check his CMP and CBC

## 2021-11-08 NOTE — Assessment & Plan Note (Signed)
I discussed the Reprieve trial and consideration of statin therapy and will prescribe rosuvastatin

## 2021-11-08 NOTE — Assessment & Plan Note (Signed)
He continues to do well on Dovato and no issues except his copay card had run out.  Now refilled.  Labs today and he will rtc in 6 months.

## 2021-11-09 LAB — T-HELPER CELL (CD4) - (RCID CLINIC ONLY)
CD4 % Helper T Cell: 32 % — ABNORMAL LOW (ref 33–65)
CD4 T Cell Abs: 565 /uL (ref 400–1790)

## 2021-11-10 LAB — HIV-1 RNA QUANT-NO REFLEX-BLD
HIV 1 RNA Quant: <20 Copies/mL — ABNORMAL HIGH
HIV-1 RNA Quant, Log: <1.3 Log cps/mL — ABNORMAL HIGH

## 2021-11-14 ENCOUNTER — Other Ambulatory Visit: Payer: Self-pay | Admitting: Pharmacist

## 2021-11-14 DIAGNOSIS — B20 Human immunodeficiency virus [HIV] disease: Secondary | ICD-10-CM

## 2021-11-14 MED ORDER — DOVATO 50-300 MG PO TABS: 1.0000 | ORAL_TABLET | Freq: Every day | ORAL | 0 refills | Status: AC

## 2021-11-14 NOTE — Progress Notes (Signed)
Medication Samples have been provided to the patient.  Drug name: Dovato        Strength: 50/300 mg         Qty: 28 tablets (2 bottles)   LOT: [id number]   Exp.Date: 11/2022  Dosing instructions: Take one tablet by mouth once daily  The patient has been instructed regarding the correct time, dose, and frequency of taking this medication, including desired effects and most common side effects.   Teira Arcilla L. Kieon Lawhorn, PharmD, BCIDP, AAHIVP, CPP Clinical Pharmacist Practitioner Infectious Diseases Clinical Pharmacist [facility] 12/29/2019, 10:07 AM  

## 2021-11-25 ENCOUNTER — Ambulatory Visit: Payer: BC Managed Care – PPO | Admitting: Internal Medicine

## 2021-11-29 ENCOUNTER — Other Ambulatory Visit (HOSPITAL_COMMUNITY): Payer: Self-pay

## 2021-12-07 ENCOUNTER — Other Ambulatory Visit (HOSPITAL_COMMUNITY): Payer: Self-pay

## 2021-12-12 ENCOUNTER — Other Ambulatory Visit (HOSPITAL_COMMUNITY): Payer: Self-pay

## 2021-12-14 ENCOUNTER — Other Ambulatory Visit (HOSPITAL_COMMUNITY): Payer: Self-pay

## 2021-12-15 ENCOUNTER — Ambulatory Visit (INDEPENDENT_AMBULATORY_CARE_PROVIDER_SITE_OTHER): Payer: BC Managed Care – PPO | Admitting: Internal Medicine

## 2021-12-15 ENCOUNTER — Other Ambulatory Visit (HOSPITAL_COMMUNITY): Payer: Self-pay

## 2021-12-15 ENCOUNTER — Encounter: Payer: Self-pay | Admitting: Internal Medicine

## 2021-12-15 ENCOUNTER — Other Ambulatory Visit: Payer: Self-pay

## 2021-12-15 VITALS — BP 144/94 | HR 76 | Temp 97.8°F | Ht 71.0 in | Wt 137.0 lb

## 2021-12-15 DIAGNOSIS — Z113 Encounter for screening for infections with a predominantly sexual mode of transmission: Secondary | ICD-10-CM

## 2021-12-15 DIAGNOSIS — G609 Hereditary and idiopathic neuropathy, unspecified: Secondary | ICD-10-CM

## 2021-12-15 DIAGNOSIS — B20 Human immunodeficiency virus [HIV] disease: Secondary | ICD-10-CM

## 2021-12-15 DIAGNOSIS — Z79899 Other long term (current) drug therapy: Secondary | ICD-10-CM

## 2021-12-15 MED ORDER — DOVATO 50-300 MG PO TABS
1.0000 | ORAL_TABLET | Freq: Every day | ORAL | 11 refills | Status: DC
  Filled 2021-12-15 – 2022-01-06 (×2): qty 30, 30d supply, fill #0
  Filled 2022-02-03: qty 30, 30d supply, fill #1
  Filled 2022-03-03: qty 30, 30d supply, fill #2
  Filled 2022-04-04: qty 30, 30d supply, fill #3
  Filled 2022-05-24: qty 30, 30d supply, fill #4
  Filled 2022-07-25: qty 30, 30d supply, fill #5
  Filled 2022-08-18: qty 30, 30d supply, fill #6
  Filled 2022-10-05: qty 30, 30d supply, fill #7
  Filled 2022-10-27: qty 30, 30d supply, fill #8
  Filled 2022-11-22: qty 30, 30d supply, fill #9

## 2021-12-15 NOTE — Progress Notes (Signed)
   Subjective:    Patient ID: Hector Armstrong, male    DOB: 1975/02/06, 46 y.o.   MRN: [id number]  HPI [name] is here to review his recent labs. He has HIV and has been well-controlled on Dovato with no missed doses.  No concerns today except a small copay for his final month of the year.    Review of Systems  Constitutional:  Negative for fatigue.  Gastrointestinal:  Negative for diarrhea.  Skin:  Negative for rash.       Objective:   Physical Exam Eyes:     General: No scleral icterus. Pulmonary:     Effort: Pulmonary effort is normal.  Neurological:     Mental Status: He is alert.   SH: no tobacco        Assessment & Plan:

## 2021-12-15 NOTE — Assessment & Plan Note (Signed)
Doing well, labs reviewed with him and refills sent.  No changes and he will rtc in 6 months.

## 2021-12-15 NOTE — Assessment & Plan Note (Signed)
Stable symptoms at this time. 

## 2021-12-16 ENCOUNTER — Other Ambulatory Visit (HOSPITAL_COMMUNITY): Payer: Self-pay

## 2021-12-22 ENCOUNTER — Other Ambulatory Visit: Payer: Self-pay | Admitting: Pharmacist

## 2021-12-22 DIAGNOSIS — B20 Human immunodeficiency virus [HIV] disease: Secondary | ICD-10-CM

## 2021-12-22 MED ORDER — DOVATO 50-300 MG PO TABS: 1.0000 | ORAL_TABLET | Freq: Every day | ORAL | 0 refills | Status: AC

## 2021-12-22 NOTE — Progress Notes (Signed)
Medication Samples have been provided to the patient.  Drug name: Dovato        Strength: 50/300 mg         Qty: 14 tablets (2 bottles)   LOT: [id number]   Exp.Date: 03/2023  Dosing instructions: Take one tablet by mouth once daily  The patient has been instructed regarding the correct time, dose, and frequency of taking this medication, including desired effects and most common side effects.   Rani Idler L. Jannette Fogo, PharmD, BCIDP, AAHIVP, CPP Clinical Pharmacist Practitioner Infectious Diseases Clinical Pharmacist [facility] 12/29/2019, 10:07 AM

## 2021-12-27 ENCOUNTER — Other Ambulatory Visit (HOSPITAL_COMMUNITY): Payer: Self-pay

## 2021-12-30 ENCOUNTER — Other Ambulatory Visit: Payer: Self-pay

## 2022-01-04 ENCOUNTER — Other Ambulatory Visit: Payer: Self-pay

## 2022-01-06 ENCOUNTER — Other Ambulatory Visit (HOSPITAL_COMMUNITY): Payer: Self-pay

## 2022-01-06 ENCOUNTER — Other Ambulatory Visit: Payer: Self-pay

## 2022-01-10 ENCOUNTER — Other Ambulatory Visit: Payer: Self-pay

## 2022-01-10 ENCOUNTER — Other Ambulatory Visit (HOSPITAL_BASED_OUTPATIENT_CLINIC_OR_DEPARTMENT_OTHER): Payer: Self-pay

## 2022-01-31 ENCOUNTER — Other Ambulatory Visit (HOSPITAL_COMMUNITY): Payer: Self-pay

## 2022-02-03 ENCOUNTER — Other Ambulatory Visit (HOSPITAL_COMMUNITY): Payer: Self-pay

## 2022-02-28 ENCOUNTER — Other Ambulatory Visit (HOSPITAL_COMMUNITY): Payer: Self-pay

## 2022-03-03 ENCOUNTER — Other Ambulatory Visit (HOSPITAL_COMMUNITY): Payer: Self-pay

## 2022-03-07 ENCOUNTER — Other Ambulatory Visit (HOSPITAL_COMMUNITY): Payer: Self-pay

## 2022-04-04 ENCOUNTER — Other Ambulatory Visit (HOSPITAL_COMMUNITY): Payer: Self-pay

## 2022-04-05 ENCOUNTER — Other Ambulatory Visit (HOSPITAL_COMMUNITY): Payer: Self-pay

## 2022-04-27 ENCOUNTER — Other Ambulatory Visit (HOSPITAL_COMMUNITY): Payer: Self-pay

## 2022-05-01 ENCOUNTER — Other Ambulatory Visit: Payer: Self-pay

## 2022-05-01 ENCOUNTER — Other Ambulatory Visit (HOSPITAL_COMMUNITY): Payer: Self-pay

## 2022-05-03 ENCOUNTER — Other Ambulatory Visit: Payer: Self-pay

## 2022-05-18 ENCOUNTER — Other Ambulatory Visit (HOSPITAL_COMMUNITY): Payer: Self-pay

## 2022-05-22 ENCOUNTER — Other Ambulatory Visit (HOSPITAL_COMMUNITY): Payer: Self-pay

## 2022-05-24 ENCOUNTER — Other Ambulatory Visit (HOSPITAL_COMMUNITY): Payer: Self-pay

## 2022-05-25 ENCOUNTER — Other Ambulatory Visit: Payer: Self-pay

## 2022-05-30 ENCOUNTER — Ambulatory Visit: Payer: BC Managed Care – PPO | Admitting: Internal Medicine

## 2022-06-08 ENCOUNTER — Ambulatory Visit: Payer: BC Managed Care – PPO | Admitting: Internal Medicine

## 2022-06-26 ENCOUNTER — Encounter (HOSPITAL_COMMUNITY): Payer: Self-pay

## 2022-06-26 ENCOUNTER — Other Ambulatory Visit (HOSPITAL_COMMUNITY): Payer: Self-pay

## 2022-07-03 ENCOUNTER — Other Ambulatory Visit (HOSPITAL_COMMUNITY): Payer: Self-pay

## 2022-07-03 ENCOUNTER — Telehealth: Payer: Self-pay | Admitting: Pharmacy Technician

## 2022-07-03 NOTE — Telephone Encounter (Signed)
Sent msg to pt

## 2022-07-25 ENCOUNTER — Other Ambulatory Visit (HOSPITAL_COMMUNITY): Payer: Self-pay

## 2022-08-04 ENCOUNTER — Other Ambulatory Visit (HOSPITAL_COMMUNITY): Payer: Self-pay

## 2022-08-18 ENCOUNTER — Other Ambulatory Visit (HOSPITAL_COMMUNITY): Payer: Self-pay

## 2022-08-22 ENCOUNTER — Other Ambulatory Visit: Payer: Self-pay

## 2022-09-25 ENCOUNTER — Encounter (HOSPITAL_COMMUNITY): Payer: Self-pay

## 2022-09-25 ENCOUNTER — Other Ambulatory Visit (HOSPITAL_COMMUNITY): Payer: Self-pay

## 2022-10-05 ENCOUNTER — Other Ambulatory Visit: Payer: Self-pay

## 2022-10-27 ENCOUNTER — Other Ambulatory Visit: Payer: Self-pay

## 2022-10-27 NOTE — Progress Notes (Signed)
Specialty Pharmacy Refill Coordination Note  Hector Armstrong is a 47 y.o. male contacted today regarding refills of specialty medication(s) Dolutegravir-Lamivudine   Patient requested Delivery   Delivery date: 10/31/22   Verified address: [address]   [place] [address]   Medication will be filled on 10/30/22.

## 2022-10-27 NOTE — Progress Notes (Signed)
Specialty Pharmacy Ongoing Clinical Assessment Note  Hector Armstrong is a 47 y.o. male who is being followed by the specialty pharmacy service for RxSp HIV   Patient's specialty medication(s) reviewed today: Dolutegravir-Lamivudine   Missed doses in the last 4 weeks: 0   Patient/Caregiver did not have any additional questions or concerns.   Therapeutic benefit summary: Patient is achieving benefit   Adverse events/side effects summary: No adverse events/side effects   Patient's therapy is appropriate to: Continue    Goals Addressed             This Visit's Progress    Achieve Undetectable HIV Viral Load < 20       Patient is on track. Patient will maintain adherence. Viral load has been undetectable long term. Reviewed labs from 11/08/21.          Follow up:  6 months  Otto Herb Specialty Pharmacist

## 2022-10-30 ENCOUNTER — Other Ambulatory Visit (HOSPITAL_COMMUNITY): Payer: Self-pay

## 2022-11-16 ENCOUNTER — Other Ambulatory Visit (HOSPITAL_COMMUNITY): Payer: Self-pay

## 2022-11-20 ENCOUNTER — Encounter (HOSPITAL_COMMUNITY): Payer: Self-pay

## 2022-11-20 ENCOUNTER — Other Ambulatory Visit (HOSPITAL_COMMUNITY): Payer: Self-pay

## 2022-11-22 ENCOUNTER — Other Ambulatory Visit (HOSPITAL_COMMUNITY): Payer: Self-pay | Admitting: Pharmacy Technician

## 2022-11-22 ENCOUNTER — Other Ambulatory Visit (HOSPITAL_COMMUNITY): Payer: Self-pay

## 2022-11-22 NOTE — Progress Notes (Signed)
Specialty Pharmacy Refill Coordination Note  Hector Armstrong is a 48 y.o. male contacted today regarding refills of specialty medication(s) Dolutegravir-Lamivudine   Patient requested Delivery   Delivery date: 11/29/22   Verified address: [address] [place], [place]   Medication will be filled on 11/28/22.

## 2022-12-18 ENCOUNTER — Other Ambulatory Visit (HOSPITAL_COMMUNITY): Payer: Self-pay

## 2022-12-22 ENCOUNTER — Encounter (HOSPITAL_COMMUNITY): Payer: Self-pay

## 2022-12-22 ENCOUNTER — Other Ambulatory Visit (HOSPITAL_COMMUNITY): Payer: Self-pay

## 2022-12-25 ENCOUNTER — Other Ambulatory Visit: Payer: Self-pay

## 2023-01-02 ENCOUNTER — Other Ambulatory Visit: Payer: Self-pay | Admitting: Internal Medicine

## 2023-01-02 ENCOUNTER — Other Ambulatory Visit: Payer: Self-pay

## 2023-01-02 ENCOUNTER — Other Ambulatory Visit (HOSPITAL_COMMUNITY): Payer: Self-pay

## 2023-01-02 DIAGNOSIS — B20 Human immunodeficiency virus [HIV] disease: Secondary | ICD-10-CM

## 2023-01-02 MED ORDER — DOVATO 50-300 MG PO TABS
1.0000 | ORAL_TABLET | Freq: Every day | ORAL | 0 refills | Status: DC
  Filled 2023-01-03: qty 30, 30d supply, fill #0

## 2023-01-02 NOTE — Progress Notes (Addendum)
Must have appointment before patient can get any refills

## 2023-01-03 ENCOUNTER — Other Ambulatory Visit (HOSPITAL_COMMUNITY): Payer: Self-pay

## 2023-01-03 ENCOUNTER — Other Ambulatory Visit: Payer: Self-pay

## 2023-01-05 ENCOUNTER — Other Ambulatory Visit (HOSPITAL_COMMUNITY): Payer: Self-pay

## 2023-01-05 ENCOUNTER — Other Ambulatory Visit: Payer: Self-pay

## 2023-01-05 ENCOUNTER — Other Ambulatory Visit: Payer: BC Managed Care – PPO

## 2023-01-05 ENCOUNTER — Other Ambulatory Visit: Payer: Self-pay | Admitting: Pharmacist

## 2023-01-05 DIAGNOSIS — B20 Human immunodeficiency virus [HIV] disease: Secondary | ICD-10-CM

## 2023-01-05 DIAGNOSIS — Z79899 Other long term (current) drug therapy: Secondary | ICD-10-CM

## 2023-01-05 DIAGNOSIS — Z113 Encounter for screening for infections with a predominantly sexual mode of transmission: Secondary | ICD-10-CM

## 2023-01-05 MED ORDER — DOVATO 50-300 MG PO TABS: 1.0000 | ORAL_TABLET | Freq: Every day | ORAL | Status: DC

## 2023-01-05 NOTE — Progress Notes (Signed)
Medication Samples have been provided to the patient.  Drug name: Dovato        Strength: 50/300 mg         Qty: 28  Tablets (2 bottles) LOT: [id number]   Exp.Date: 7/26  Dosing instructions: Take one tablet by mouth once daily  The patient has been instructed regarding the correct time, dose, and frequency of taking this medication, including desired effects and most common side effects.   Margarite Gouge, PharmD, CPP, BCIDP, AAHIVP Clinical Pharmacist Practitioner Infectious Diseases Clinical Pharmacist [facility]

## 2023-01-08 LAB — CBC WITH DIFFERENTIAL/PLATELET
Absolute Lymphocytes: 2391 {cells}/uL (ref 850–3900)
Absolute Monocytes: 578 {cells}/uL (ref 200–950)
Basophils Absolute: 49 {cells}/uL (ref 0–200)
Basophils Relative: 1 %
Eosinophils Absolute: 250 {cells}/uL (ref 15–500)
Eosinophils Relative: 5.1 %
HCT: 41.1 % (ref 38.5–50.0)
Hemoglobin: 14 g/dL (ref 13.2–17.1)
MCH: 31.1 pg (ref 27.0–33.0)
MCHC: 34.1 g/dL (ref 32.0–36.0)
MCV: 91.3 fL (ref 80.0–100.0)
MPV: 11 fL (ref 7.5–12.5)
Monocytes Relative: 11.8 %
Neutro Abs: 1632 {cells}/uL (ref 1500–7800)
Neutrophils Relative %: 33.3 %
Platelets: 247 10*3/uL (ref 140–400)
RBC: 4.5 10*6/uL (ref 4.20–5.80)
RDW: 12.2 % (ref 11.0–15.0)
Total Lymphocyte: 48.8 %
WBC: 4.9 10*3/uL (ref 3.8–10.8)

## 2023-01-08 LAB — COMPLETE METABOLIC PANEL WITH GFR
AG Ratio: 1.8 (calc) (ref 1.0–2.5)
ALT: 10 U/L (ref 9–46)
AST: 13 U/L (ref 10–40)
Albumin: 4.3 g/dL (ref 3.6–5.1)
Alkaline phosphatase (APISO): 59 U/L (ref 36–130)
BUN: 21 mg/dL (ref 7–25)
CO2: 26 mmol/L (ref 20–32)
Calcium: 9 mg/dL (ref 8.6–10.3)
Chloride: 108 mmol/L (ref 98–110)
Creat: 1.2 mg/dL (ref 0.60–1.29)
Globulin: 2.4 g/dL (ref 1.9–3.7)
Glucose, Bld: 81 mg/dL (ref 65–99)
Potassium: 4.2 mmol/L (ref 3.5–5.3)
Sodium: 142 mmol/L (ref 135–146)
Total Bilirubin: 0.3 mg/dL (ref 0.2–1.2)
Total Protein: 6.7 g/dL (ref 6.1–8.1)
eGFR: 75 mL/min/{1.73_m2} (ref >=60)

## 2023-01-08 LAB — LIPID PANEL
Cholesterol: 157 mg/dL (ref <200)
HDL: 70 mg/dL (ref >=40)
LDL Cholesterol (Calc): 69 mg/dL
Non-HDL Cholesterol (Calc): 87 mg/dL (ref <130)
Total CHOL/HDL Ratio: 2.2 (calc) (ref <5.0)
Triglycerides: 95 mg/dL (ref <150)

## 2023-01-08 LAB — HIV-1 RNA QUANT-NO REFLEX-BLD
HIV 1 RNA Quant: NOT DETECTED {copies}/mL
HIV-1 RNA Quant, Log: NOT DETECTED {Log}

## 2023-01-08 LAB — RPR: RPR Ser Ql: NONREACTIVE

## 2023-01-08 LAB — T-HELPER CELLS (CD4) COUNT (NOT AT ARMC)
Absolute CD4: 803 {cells}/uL (ref 490–1740)
CD4 T Helper %: 33 % (ref 30–61)
Total lymphocyte count: 2466 {cells}/uL (ref 850–3900)

## 2023-01-08 LAB — C. TRACHOMATIS/N. GONORRHOEAE RNA
C. trachomatis RNA, TMA: NOT DETECTED
N. gonorrhoeae RNA, TMA: NOT DETECTED

## 2023-01-18 ENCOUNTER — Other Ambulatory Visit (HOSPITAL_COMMUNITY): Payer: Self-pay

## 2023-01-19 ENCOUNTER — Other Ambulatory Visit (HOSPITAL_COMMUNITY): Payer: Self-pay

## 2023-01-25 ENCOUNTER — Other Ambulatory Visit: Payer: Self-pay

## 2023-01-25 ENCOUNTER — Other Ambulatory Visit (HOSPITAL_COMMUNITY): Payer: Self-pay

## 2023-01-25 ENCOUNTER — Ambulatory Visit (INDEPENDENT_AMBULATORY_CARE_PROVIDER_SITE_OTHER): Payer: BC Managed Care – PPO | Admitting: Internal Medicine

## 2023-01-25 ENCOUNTER — Encounter: Payer: Self-pay | Admitting: Internal Medicine

## 2023-01-25 VITALS — BP 142/84 | HR 78 | Resp 16 | Ht 71.0 in | Wt 137.0 lb

## 2023-01-25 DIAGNOSIS — B20 Human immunodeficiency virus [HIV] disease: Secondary | ICD-10-CM

## 2023-01-25 MED ORDER — ATORVASTATIN CALCIUM 40 MG PO TABS
40.0000 mg | ORAL_TABLET | Freq: Every day | ORAL | 11 refills | Status: DC
  Filled 2023-01-25: qty 30, 30d supply, fill #0

## 2023-01-25 MED ORDER — DOLUTEGRAVIR-LAMIVUDINE 50-300 MG PO TABS
1.0000 | ORAL_TABLET | Freq: Every day | ORAL | 11 refills | Status: DC
  Filled 2023-01-25 (×2): qty 30, 30d supply, fill #0
  Filled 2023-03-05: qty 30, 30d supply, fill #1
  Filled 2023-04-25: qty 30, 30d supply, fill #2
  Filled 2023-05-18: qty 30, 30d supply, fill #3

## 2023-01-25 MED ORDER — DOLUTEGRAVIR-LAMIVUDINE 50-300 MG PO TABS: 1.0000 | ORAL_TABLET | Freq: Every day | ORAL | 11 refills | Status: DC

## 2023-01-25 MED ORDER — ATORVASTATIN CALCIUM 40 MG PO TABS
40.0000 mg | ORAL_TABLET | Freq: Every day | ORAL | 11 refills | Status: AC
  Filled 2023-01-25: qty 30, 30d supply, fill #0

## 2023-01-25 NOTE — Progress Notes (Signed)
 Specialty Pharmacy Refill Coordination Note  Hector Armstrong is a 48 y.o. male contacted today regarding refills of specialty medication(s) Dolutegravir -lamiVUDine  (DOVATO )   Patient requested Delivery   Delivery date: 02/07/23   Verified address: [address] [place] [address]   Medication will be filled on 02/06/23.

## 2023-01-25 NOTE — Patient Instructions (Addendum)
 For your next visit with dr Efrain, please ask for these tests along with your routine hiv testing 1) hepatitis B serology 2) quantiferon gold for tb exposure screening    Continue dovato   Start lipitor 40 mg once a day to reduce risk heart attack/stroke. Google reprieve hiv nejm to see more about why I recommend this, if you are interested   Urine smell just make sure you take in more water  and try to keep urine color white. You seems just a little dehydrated

## 2023-01-25 NOTE — Progress Notes (Signed)
   Subjective:    Patient ID: Hector Armstrong, male    DOB: 13-Sep-1975, 48 y.o.   MRN: [id number]  HPI Hector Armstrong is here to review his recent labs. He has HIV and has been well-controlled on Dovato  with no missed doses.  No concerns today except a small copay for his final month of the year.   01/25/23 id clinic f/u Patient usually sees dr Efrain who is not available Patient doing well on dovato  no missed dose last 4 weeks No concern in his health today He did labs a couple weeks prior to clinic visit and we reviewed Patient has blue cross blue shield  No health concern otherwise Needs paperwork for child support  Patient says his urine smells strong -- just started for a few days. Urine color dark yellow. No uti sx otherwise. Drinks lots of soda     Review of Systems  Constitutional:  Negative for fatigue.  Gastrointestinal:  Negative for diarrhea.  Skin:  Negative for rash.       Objective:     Vitals:   01/25/23 1346  BP: (!) 142/84  Pulse: 78  Resp: 16   General/constitutional: no distress, pleasant HEENT: Normocephalic, PER, Conj Clear, EOMI, Oropharynx clear Neck supple CV: rrr no mrg Lungs: clear to auscultation, normal respiratory effort Abd: Soft, Nontender Ext: no edema Skin: No Rash Neuro: nonfocal MSK: no peripheral joint swelling/tenderness/warmth; back spines nontender  Labs: Lab Results  Component Value Date   WBC 4.9 01/05/2023   HGB 14.0 01/05/2023   HCT 41.1 01/05/2023   MCV 91.3 01/05/2023   PLT 247 01/05/2023   Last metabolic panel Lab Results  Component Value Date   GLUCOSE 81 01/05/2023   NA 142 01/05/2023   K 4.2 01/05/2023   CL 108 01/05/2023   CO2 26 01/05/2023   BUN 21 01/05/2023   CREATININE 1.20 01/05/2023   EGFR 75 01/05/2023   CALCIUM  9.0 01/05/2023   PROT 6.7 01/05/2023   ALBUMIN  4.3 06/13/2015   BILITOT 0.3 01/05/2023   ALKPHOS 62 06/13/2015   AST 13 01/05/2023   ALT 10 01/05/2023   ANIONGAP 8 08/26/2015    Hiv: Lab Results  Component Value Date   HIV1RNAQUANT Not Detected 01/05/2023   Lab Results  Component Value Date   CD4TCELL 33 01/05/2023   CD4TABS 565 11/08/2021   12/2022 rpr and urine gc/chlam negative   .      Assessment & Plan:    #hiv #reprieve Heterosexual contraction Under insurance blue cross blue shield  Compliant and well controlled with dovato . Discussed option of injectable and he doesn't want that at this time   Discuss reprieve trial. Patient previously on crestor  but doesn't recall why he went off. First time he heard about reprieve and wants to go back on hmg-coA   -discussed u=u -encourage compliance -continue current HIV medication -start lipitor 40 mg daily -labs 6 months from now (he wants to check hep b immunity/serology) -f/u in 6 months with dr Efrain   #std screen Divorced Last sexually active 6 months ago  Urine std and rpr testing reviewed negative   #urine smell Appears dehydrated  -Advise more fluid intake avoid sugar/soda/coffee too much -no sign of uti   #hcm Vaccines reviewed and tdap, prevnar 13, meningococcal utd 2019 hepatitis b sAb nonreactive and advise patient to recheck next visit and try vaccination with high dose heplisav if not done previously Tb screening can do next visit with dr Efrain

## 2023-02-06 ENCOUNTER — Other Ambulatory Visit: Payer: Self-pay

## 2023-02-08 ENCOUNTER — Other Ambulatory Visit: Payer: Self-pay

## 2023-02-22 ENCOUNTER — Other Ambulatory Visit (HOSPITAL_COMMUNITY): Payer: Self-pay

## 2023-02-23 ENCOUNTER — Telehealth: Payer: Self-pay

## 2023-02-23 NOTE — Telephone Encounter (Signed)
 Sounds good - yes I would definitely like an archive on him given prior use of Atripla and some viremia due to missed doses at that time. Once we have that archive, we will extensively counsel him as well since it seems he has occasional fill history gaps and some missed appointments/gaps between visits here.

## 2023-02-23 NOTE — Telephone Encounter (Addendum)
 Patient is scheduled for labs on 03/15/23. Can someone put labs in for him please? Chesnie Capell Adel Holt, CMA

## 2023-02-23 NOTE — Telephone Encounter (Signed)
 Patient called stating you guys discussed starting Cabenuva  at his appointment yesterday.  Patient would like to proceed with starting the Cabenuva .  Does the patient need additional labs? If so can you orders and I will schedule him to come in for labs. Please advise.    Also adding pharmacy to the message so they can discuss the medication with him.  Finnlee Guarnieri ONEIDA Ligas, CMA

## 2023-02-23 NOTE — Telephone Encounter (Signed)
 Please remind Monday

## 2023-02-23 NOTE — Telephone Encounter (Signed)
 The archive will need to be ordered on paper when the patient arrives - I'll be happy to fill out/sign then!

## 2023-02-26 NOTE — Telephone Encounter (Signed)
 Form in the lab signed for the patient. Latrisha Coiro Adel Holt, CMA

## 2023-03-01 ENCOUNTER — Other Ambulatory Visit: Payer: Self-pay

## 2023-03-05 ENCOUNTER — Other Ambulatory Visit: Payer: Self-pay

## 2023-03-05 ENCOUNTER — Other Ambulatory Visit (HOSPITAL_COMMUNITY): Payer: Self-pay

## 2023-03-05 NOTE — Progress Notes (Signed)
 Specialty Pharmacy Refill Coordination Note  Hector Armstrong is a 48 y.o. male contacted today regarding refills of specialty medication(s) Dolutegravir-lamiVUDine (DOVATO)   Patient requested Delivery   Delivery date: 03/07/23   Verified address: [address] [place] [address]   Medication will be filled on 03/06/23.

## 2023-03-06 ENCOUNTER — Other Ambulatory Visit: Payer: Self-pay

## 2023-03-15 ENCOUNTER — Other Ambulatory Visit: Payer: BC Managed Care – PPO

## 2023-03-16 ENCOUNTER — Other Ambulatory Visit: Payer: Self-pay

## 2023-03-16 ENCOUNTER — Other Ambulatory Visit: Payer: BC Managed Care – PPO

## 2023-03-22 ENCOUNTER — Other Ambulatory Visit: Payer: Self-pay

## 2023-03-27 ENCOUNTER — Other Ambulatory Visit (HOSPITAL_COMMUNITY): Payer: Self-pay

## 2023-03-30 ENCOUNTER — Other Ambulatory Visit (HOSPITAL_COMMUNITY): Payer: Self-pay

## 2023-04-03 ENCOUNTER — Telehealth: Payer: Self-pay

## 2023-04-03 NOTE — Telephone Encounter (Signed)
 Patient called wanting to know if his results from archive were back, discussed that pharmacy team is still waiting on results and will reach out to him to discuss once they receive them.   Sandie Ano, RN

## 2023-04-25 ENCOUNTER — Other Ambulatory Visit: Payer: Self-pay | Admitting: Pharmacy Technician

## 2023-04-25 ENCOUNTER — Other Ambulatory Visit: Payer: Self-pay

## 2023-04-25 NOTE — Progress Notes (Signed)
 Specialty Pharmacy Refill Coordination Note  Hector Armstrong is a 48 y.o. male contacted today regarding refills of specialty medication(s) Dolutegravir-lamiVUDine (DOVATO)   Patient requested Delivery   Delivery date: 04/27/23   Verified address: [address] [place] [address]   Medication will be filled on 04/26/23.

## 2023-04-26 ENCOUNTER — Other Ambulatory Visit: Payer: Self-pay

## 2023-05-01 ENCOUNTER — Encounter: Payer: Self-pay | Admitting: Pharmacist

## 2023-05-01 NOTE — Telephone Encounter (Signed)
 Confirmed with Hector Armstrong today that, unfortunately, Hector Armstrong's Genosure Archive was not sent to [facility] appropriately for results. She has worked on a way to improve this system with a Financial controller at [employer]. I would prefer recollection d/t history of non-adherence on Atripla. LVM to discuss this with patient and reschedule Archive so that it could definitely be resulted by the time he sees you in July.

## 2023-05-03 ENCOUNTER — Other Ambulatory Visit

## 2023-05-03 ENCOUNTER — Other Ambulatory Visit: Payer: Self-pay

## 2023-05-03 NOTE — Telephone Encounter (Signed)
 Coming in this morning for repeat Archive; will send you results once available.

## 2023-05-17 ENCOUNTER — Other Ambulatory Visit: Payer: Self-pay

## 2023-05-18 ENCOUNTER — Other Ambulatory Visit: Payer: Self-pay

## 2023-05-18 NOTE — Progress Notes (Signed)
 Specialty Pharmacy Ongoing Clinical Assessment Note  Hector Armstrong is a 48 y.o. male who is being followed by the specialty pharmacy service for RxSp HIV   Patient's specialty medication(s) reviewed today: Dolutegravir -lamiVUDine  (DOVATO )   Missed doses in the last 4 weeks: 0   Patient/Caregiver did not have any additional questions or concerns.   Therapeutic benefit summary: Patient is achieving benefit   Adverse events/side effects summary: No adverse events/side effects   Patient's therapy is appropriate to: Continue    Goals Addressed             This Visit's Progress    Achieve Undetectable HIV Viral Load < 20   On track    Patient is on track. Patient will maintain adherence. Viral load has been undetectable long term. Reviewed labs from 01/05/23.          Follow up:  6 months  Reneisha Stilley M Somnang Mahan Specialty Pharmacist

## 2023-05-18 NOTE — Progress Notes (Signed)
 Specialty Pharmacy Refill Coordination Note  Hector Armstrong is a 48 y.o. male contacted today regarding refills of specialty medication(s) Dolutegravir -lamiVUDine  (DOVATO )   Patient requested Delivery   Delivery date: 05/22/23   Verified address: [address]   [place] [address]   Medication will be filled on 05/21/23.

## 2023-05-21 ENCOUNTER — Other Ambulatory Visit: Payer: Self-pay

## 2023-05-22 ENCOUNTER — Telehealth: Payer: Self-pay | Admitting: Pharmacist

## 2023-05-22 NOTE — Telephone Encounter (Signed)
 The first time was because the sample was lost - this time it said it was insufficient quantity.

## 2023-05-22 NOTE — Telephone Encounter (Signed)
 He's coming back Friday for repeat Archive

## 2023-05-22 NOTE — Telephone Encounter (Signed)
 Patient interested in [facility] but has displayed periods of non-adherence on Atripla with occasional gaps in his fill history years ago. Conservatively recommended assessing Genosure Archive which failed to result two months ago. Recollected end of April; this sample was ALSO insufficient. I would prefer to have Archive results before transitioning given slight likelihood for NNRTI resistance. Let me know your thoughts. Happy to discuss with patient if they are willing to recollect again or stay on orals at this time.  Added failed results to media tab.   Nicklas Barns, PharmD, CPP, BCIDP, AAHIVP Clinical Pharmacist Practitioner Infectious Diseases Clinical Pharmacist [facility] for Infect[facility]

## 2023-05-22 NOTE — Telephone Encounter (Signed)
 Thankful he is a good sport

## 2023-05-25 ENCOUNTER — Other Ambulatory Visit

## 2023-06-01 ENCOUNTER — Other Ambulatory Visit

## 2023-06-06 NOTE — Progress Notes (Signed)
 The 10-year ASCVD risk score (Arnett DK, et al., 2019) is: 4.9%   Values used to calculate the score:     Age: 48 years     Sex: Male     Is Non-Hispanic African American: Yes     Diabetic: No     Tobacco smoker: No     Systolic Blood Pressure: 142 mmHg     Is BP treated: No     HDL Cholesterol: 70 mg/dL     Total Cholesterol: 157 mg/dL  Currently prescribed atorvastatin  40 mg.  Zohaib Heeney, BSN, RN

## 2023-06-07 ENCOUNTER — Other Ambulatory Visit: Payer: Self-pay

## 2023-06-12 ENCOUNTER — Other Ambulatory Visit (HOSPITAL_COMMUNITY): Payer: Self-pay

## 2023-06-12 ENCOUNTER — Other Ambulatory Visit: Payer: Self-pay

## 2023-06-20 NOTE — Telephone Encounter (Signed)
 FYI - patient no showed for repeat archive testing on 5/9 and 5/16. Will follow up at end of June.

## 2023-07-09 ENCOUNTER — Other Ambulatory Visit: Payer: Self-pay

## 2023-07-09 DIAGNOSIS — B20 Human immunodeficiency virus [HIV] disease: Secondary | ICD-10-CM

## 2023-07-09 DIAGNOSIS — Z113 Encounter for screening for infections with a predominantly sexual mode of transmission: Secondary | ICD-10-CM

## 2023-07-09 DIAGNOSIS — Z79899 Other long term (current) drug therapy: Secondary | ICD-10-CM

## 2023-07-12 ENCOUNTER — Other Ambulatory Visit: Payer: BC Managed Care – PPO

## 2023-07-26 ENCOUNTER — Ambulatory Visit: Payer: Self-pay | Admitting: Internal Medicine

## 2023-07-27 ENCOUNTER — Other Ambulatory Visit: Payer: Self-pay | Admitting: Internal Medicine

## 2023-07-27 ENCOUNTER — Other Ambulatory Visit

## 2023-07-27 ENCOUNTER — Other Ambulatory Visit (HOSPITAL_COMMUNITY): Payer: Self-pay

## 2023-07-27 ENCOUNTER — Other Ambulatory Visit: Payer: Self-pay

## 2023-07-27 DIAGNOSIS — B20 Human immunodeficiency virus [HIV] disease: Secondary | ICD-10-CM

## 2023-07-27 DIAGNOSIS — Z113 Encounter for screening for infections with a predominantly sexual mode of transmission: Secondary | ICD-10-CM

## 2023-07-27 DIAGNOSIS — Z79899 Other long term (current) drug therapy: Secondary | ICD-10-CM

## 2023-07-27 NOTE — Progress Notes (Signed)
 Patient wants to do cab   Well controlled on dovato     Genosure achive today if negative would get on cab   Chart forwarded to pharmacy team

## 2023-07-27 NOTE — Addendum Note (Signed)
 Addended by: GRETEL TULLY HERO on: 07/27/2023 10:52 AM   Modules accepted: Orders

## 2023-07-27 NOTE — Progress Notes (Signed)
 If it doesn't run I think would go ahead and get him on it and monitor  Thanks Alan

## 2023-07-27 NOTE — Progress Notes (Signed)
 We will wait to reach out to him until archive is back. I have a feeling it will not run since his last two had issues.

## 2023-07-28 LAB — C. TRACHOMATIS/N. GONORRHOEAE RNA
C. trachomatis RNA, TMA: NOT DETECTED
N. gonorrhoeae RNA, TMA: NOT DETECTED

## 2023-07-31 LAB — CBC WITH DIFFERENTIAL/PLATELET
Absolute Lymphocytes: 2447 {cells}/uL (ref 850–3900)
Absolute Monocytes: 594 {cells}/uL (ref 200–950)
Basophils Absolute: 62 {cells}/uL (ref 0–200)
Basophils Relative: 1.1 %
Eosinophils Absolute: 353 {cells}/uL (ref 15–500)
Eosinophils Relative: 6.3 %
HCT: 45.1 % (ref 38.5–50.0)
Hemoglobin: 14.8 g/dL (ref 13.2–17.1)
MCH: 30.2 pg (ref 27.0–33.0)
MCHC: 32.8 g/dL (ref 32.0–36.0)
MCV: 92 fL (ref 80.0–100.0)
MPV: 10.7 fL (ref 7.5–12.5)
Monocytes Relative: 10.6 %
Neutro Abs: 2145 {cells}/uL (ref 1500–7800)
Neutrophils Relative %: 38.3 %
Platelets: 265 Thousand/uL (ref 140–400)
RBC: 4.9 Million/uL (ref 4.20–5.80)
RDW: 13.1 % (ref 11.0–15.0)
Total Lymphocyte: 43.7 %
WBC: 5.6 Thousand/uL (ref 3.8–10.8)

## 2023-07-31 LAB — LIPID PANEL
Cholesterol: 173 mg/dL (ref <200)
HDL: 77 mg/dL (ref >=40)
LDL Cholesterol (Calc): 81 mg/dL
Non-HDL Cholesterol (Calc): 96 mg/dL (ref <130)
Total CHOL/HDL Ratio: 2.2 (calc) (ref <5.0)
Triglycerides: 72 mg/dL (ref <150)

## 2023-07-31 LAB — RPR: RPR Ser Ql: NONREACTIVE

## 2023-07-31 LAB — COMPLETE METABOLIC PANEL WITHOUT GFR
AG Ratio: 1.8 (calc) (ref 1.0–2.5)
ALT: 9 U/L (ref 9–46)
AST: 13 U/L (ref 10–40)
Albumin: 4.4 g/dL (ref 3.6–5.1)
Alkaline phosphatase (APISO): 55 U/L (ref 36–130)
BUN: 20 mg/dL (ref 7–25)
CO2: 25 mmol/L (ref 20–32)
Calcium: 9.4 mg/dL (ref 8.6–10.3)
Chloride: 105 mmol/L (ref 98–110)
Creat: 1.19 mg/dL (ref 0.60–1.29)
Globulin: 2.4 g/dL (ref 1.9–3.7)
Glucose, Bld: 82 mg/dL (ref 65–99)
Potassium: 4.3 mmol/L (ref 3.5–5.3)
Sodium: 139 mmol/L (ref 135–146)
Total Bilirubin: 0.3 mg/dL (ref 0.2–1.2)
Total Protein: 6.8 g/dL (ref 6.1–8.1)

## 2023-07-31 LAB — T-HELPER CELLS (CD4) COUNT (NOT AT ARMC)
Absolute CD4: 694 {cells}/uL (ref 490–1740)
CD4 T Helper %: 27 % — ABNORMAL LOW (ref 30–61)
Total lymphocyte count: 2528 {cells}/uL (ref 850–3900)

## 2023-07-31 LAB — HIV-1 RNA QUANT-NO REFLEX-BLD
HIV 1 RNA Quant: NOT DETECTED {copies}/mL
HIV-1 RNA Quant, Log: NOT DETECTED {Log_copies}/mL

## 2023-08-02 ENCOUNTER — Other Ambulatory Visit (HOSPITAL_COMMUNITY): Payer: Self-pay

## 2023-08-10 ENCOUNTER — Other Ambulatory Visit: Payer: Self-pay

## 2023-08-10 ENCOUNTER — Encounter: Payer: Self-pay | Admitting: Infectious Diseases

## 2023-08-10 ENCOUNTER — Ambulatory Visit: Admitting: Infectious Diseases

## 2023-08-10 VITALS — BP 150/94 | HR 68 | Temp 97.7°F | Ht 71.0 in | Wt 135.0 lb

## 2023-08-10 DIAGNOSIS — B20 Human immunodeficiency virus [HIV] disease: Secondary | ICD-10-CM

## 2023-08-10 DIAGNOSIS — Z7185 Encounter for immunization safety counseling: Secondary | ICD-10-CM | POA: Insufficient documentation

## 2023-08-10 DIAGNOSIS — Z113 Encounter for screening for infections with a predominantly sexual mode of transmission: Secondary | ICD-10-CM

## 2023-08-10 DIAGNOSIS — Z79899 Other long term (current) drug therapy: Secondary | ICD-10-CM | POA: Insufficient documentation

## 2023-08-10 DIAGNOSIS — Z0000 Encounter for general adult medical examination without abnormal findings: Secondary | ICD-10-CM | POA: Insufficient documentation

## 2023-08-10 NOTE — Progress Notes (Signed)
 Hi Tully  Is this not the guy that I was walking by and had issues with archives in the past   Alan If he was on it before would just start again  Thanks

## 2023-08-10 NOTE — Progress Notes (Signed)
 [address], [place], [place], [address]                                                                  Phn. [phone]; Fax: [phone]                                                                             Date: 08/10/23  Reason for Visit: Routine HIV care.  HPI: Hector Armstrong is a 48 y.o.old male with a history of HIV  Interval hx/current visit: Compliant with [employer]. He is frustrated with still being unable to get cabenuva injections, refusing to get blood draws initially as he says this is 4-5 times getting blood draws this year but finally agreed for getting archive. Denies being sexually active recently. Denies smoking, alcohol and recreational drug use. He follows dentist. He reports having to establish care with PCP for being up to date on ca screening. No complaints.   ROS: As stated in above HPI; all other systems were reviewed and are otherwise negative unless noted below  No reported fever / chills, night sweats, unintentional weight loss, acute visual change, odynophagia, chest pain/pressure, new or worsened SOB or WOB, nausea, vomiting, diarrhea, dysuria, GU discharge, syncope, seizures, red/hot swollen joints, hallucinations / delusions, rashes, new allergies, unusual / excessive bleeding, swollen lymph nodes, or new hospitalizations/ED visits/Urgent Care visits since the pt was last seen.  PMH/ PSH/ FamHx / Social Hx , medications and allergies reviewed and updated as appropriate; please see corresponding tab in EHR / prior notes                                        Current Outpatient Medications on File Prior to Visit  Medication Sig Dispense Refill   atorvastatin  (LIPITOR) 40 MG tablet Take 1 tablet (40 mg total) by mouth daily. 30 tablet 11    dolutegravir -lamiVUDine  (DOVATO ) 50-300 MG tablet Take 1 tablet by mouth daily. 30 tablet 11   No current facility-administered medications on file prior to visit.   No Known Allergies  Past Medical History:  Diagnosis Date   HIV (human immunodeficiency virus infection) (HCC)    No past surgical history on file.  Social History   Socioeconomic History   Marital status: Married    Spouse name: Not on file   Number of children: Not on file   Years of education: Not on file   Highest education level: Not on file  Occupational History   Not on file  Tobacco Use   Smoking  status: Never   Smokeless tobacco: Never  Substance and Sexual Activity   Alcohol use: No   Drug use: No   Sexual activity: Not Currently    Partners: Female    Comment: declined condoms  Other Topics Concern   Not on file  Social History Narrative   Not on file   Social Drivers of Health   Financial Resource Strain: Not on file  Food Insecurity: Not on file  Transportation Needs: Not on file  Physical Activity: Not on file  Stress: Not on file  Social Connections: Not on file  Intimate Partner Violence: Not on file   No family history on file.   Vitals  BP (!) 150/94   Pulse 68   Temp 97.7 F (36.5 C) (Temporal)   Ht 5' 11 (1.803 m)   Wt 135 lb (61.2 kg)   SpO2 99%   BMI 18.83 kg/m    Examination  Gen: no acute distress HEENT: [place]/AT, no scleral icterus, no pale conjunctivae, hearing normal, oral mucosa moist Neck: Supple Cardio: Regular rate and rhythm, s1s2 Resp: Pulmonary effort normal in room air, Normal breath sounds GI: nondistended GU: Musc: Extremities: No pedal edema Skin: No rashes Neuro: grossly non focal , awake, alert and oriented * 3  Psych: Calm, cooperative  Lab Results HIV 1 RNA Quant  Date Value  07/27/2023 NOT DETECTED copies/mL  01/05/2023 Not Detected Copies/mL  11/08/2021 <20 Copies/mL (H)   CD4 T Cell Abs (/uL)  Date Value  11/08/2021 565   05/10/2020 633  07/04/2019 570   No results found for: HIV1GENOSEQ Lab Results  Component Value Date   WBC 5.6 07/27/2023   HGB 14.8 07/27/2023   HCT 45.1 07/27/2023   MCV 92.0 07/27/2023   PLT 265 07/27/2023    Lab Results  Component Value Date   CREATININE 1.19 07/27/2023   BUN 20 07/27/2023   NA 139 07/27/2023   K 4.3 07/27/2023   CL 105 07/27/2023   CO2 25 07/27/2023   Lab Results  Component Value Date   ALT 9 07/27/2023   AST 13 07/27/2023   ALKPHOS 62 06/13/2015   BILITOT 0.3 07/27/2023    Lab Results  Component Value Date   CHOL 173 07/27/2023   TRIG 72 07/27/2023   HDL 77 07/27/2023   LDLCALC 81 07/27/2023   Lab Results  Component Value Date   HAV NEG 06/01/2011   Lab Results  Component Value Date   HEPBSAG NEG 06/14/2006   HEPBSAB NON-REACTIVE 02/20/2017   Lab Results  Component Value Date   HCVAB NEG 06/14/2006   Lab Results  Component Value Date   CHLAMYDIAWP Negative 05/06/2021   N Negative 05/06/2021   No results found for: GCPROBEAPT No results found for: QUANTGOLD    Health Maintenance: Immunization History  Administered Date(s) Administered   Hepatitis A 07/18/2011   Hepatitis A, Adult 10/10/2012   Influenza Split 10/17/2010, 11/27/2011   Influenza Whole 10/02/2006, 12/09/2008, 11/12/2009   Influenza,inj,Quad PF,6+ Mos 11/11/2013, 10/09/2014, 11/07/2017, 09/17/2018, 11/08/2021   Influenza-Unspecified 12/17/2019   Meningococcal Mcv4o 07/18/2017, 01/23/2018   Moderna Sars-Covid-2 Vaccination 03/26/2019, 04/23/2019, 12/01/2019   Pfizer(Comirnaty)Fall Seasonal Vaccine 12 years and older 11/08/2021   Pneumococcal Conjugate-13 07/18/2017   Pneumococcal Polysaccharide-23 06/14/2006, 04/21/2011   Tdap 02/20/2017    Assessment/Plan: # HIV - continue Dovato  as is  - 7/11 labs reviewed and discussed with patient - Will get genosure archive today as it appears it did not run before - no pharmacy  available in clinic today and  informed patient will let Pharmacy as well as Dr Overton know regarding his concerns regarding cabenuva and will get in touch regarding plan for injections next week. - fu to be made pending above   # STD Screening  # Syphilis h/o - Urine GC and RPR negative   # Immunization  - deferred vaccine today  # Health maintenance - Hep B surface ab to see if needs to be immunized and quantiferon  - on atorvastatin   - follows dentist  - discussed to establish care with PCP for age based ca screening colon ca  Patient's labs were reviewed as well as his previous records. Patients questions were addressed and answered. Safe sex counseling done.  I spent 31 minutes involved in face-to-face and non-face-to-face activities for this patient on the day of the visit. Professional time spent includes the following activities: Preparing to see the patient (review of tests), Obtaining and/or reviewing separately obtained history (prior notes from Dr Overton), Performing a medically appropriate examination and evaluation , Ordering labs, Documenting clinical information in the EMR, Independently interpreting results (not separately reported), Communicating results to the patient, Counseling and educating the patient and Care coordination (not separately reported).   Of note, portions of this note may have been created with voice recognition software. While this note has been edited for accuracy, occasional wrong-word or 'sound-a-like' substitutions may have occurred due to the inherent limitations of voice recognition software.   Electronically signed by:  Annalee Orem, MD Infectious Disease Physician [facility] [address] [place], [place] [address] Phone: [phone]  Fax: [phone]

## 2023-08-13 ENCOUNTER — Other Ambulatory Visit (HOSPITAL_COMMUNITY): Payer: Self-pay

## 2023-08-14 ENCOUNTER — Other Ambulatory Visit: Payer: Self-pay | Admitting: Pharmacist

## 2023-08-14 ENCOUNTER — Telehealth: Payer: Self-pay | Admitting: Pharmacist

## 2023-08-14 ENCOUNTER — Other Ambulatory Visit: Payer: Self-pay

## 2023-08-14 ENCOUNTER — Other Ambulatory Visit (HOSPITAL_COMMUNITY): Payer: Self-pay

## 2023-08-14 DIAGNOSIS — B20 Human immunodeficiency virus [HIV] disease: Secondary | ICD-10-CM

## 2023-08-14 MED ORDER — CABOTEGRAVIR & RILPIVIRINE ER 600 & 900 MG/3ML IM SUER
1.0000 | INTRAMUSCULAR | 1 refills | Status: DC
  Filled 2023-08-14: qty 6, 30d supply, fill #0
  Filled 2023-09-12: qty 6, 30d supply, fill #1

## 2023-08-14 NOTE — Progress Notes (Signed)
 Specialty Pharmacy Initial Fill Coordination Note  Hector Armstrong is a 48 y.o. male contacted today regarding initial fill of specialty medication(s) Cabotegravir  & Rilpivirine  (CABENUVA )   Patient requested Courier to Provider Office   Delivery date: 08/20/23   Verified address: [address] [place] [address]   Medication will be filled on 08/17/23.   Patient is aware of 0.00 copayment.

## 2023-08-14 NOTE — Telephone Encounter (Signed)
 Patient returned call, relayed that he is good to start Cabenuva , scheduled for 8/6.  Danyella Mcginty, BSN, RN

## 2023-08-14 NOTE — Telephone Encounter (Signed)
 Reviewed genosure (pictures located in media tab from Dr. Dea) and no resistance mutation were found. He is clear to start Cabenuva  injections.   Called patient to discuss. No answer, left HIPAA compliant VM. Also sent mychart message. If patient returns call, he can be scheduled any day starting next week with pharmacy.   Gwyn Mehring L. Mariusz Jubb, PharmD, BCIDP, AAHIVP, CPP Infectious Diseases Clinical Pharmacist Practitioner Clinical Pharmacist Lead, Specialty Pharmacy [facility] for Infectious Disease

## 2023-08-14 NOTE — Telephone Encounter (Signed)
 Thanks Aundra Millet!

## 2023-08-15 LAB — HEPATITIS B SURFACE ANTIBODY,QUALITATIVE: Hep B S Ab: NONREACTIVE

## 2023-08-15 LAB — QUANTIFERON-TB GOLD PLUS
Mitogen-NIL: 9 [IU]/mL
NIL: 0.02 [IU]/mL
QuantiFERON-TB Gold Plus: NEGATIVE
TB1-NIL: 0 [IU]/mL
TB2-NIL: 0 [IU]/mL

## 2023-08-17 ENCOUNTER — Other Ambulatory Visit (HOSPITAL_COMMUNITY): Payer: Self-pay

## 2023-08-17 ENCOUNTER — Other Ambulatory Visit: Payer: Self-pay

## 2023-08-21 ENCOUNTER — Telehealth: Payer: Self-pay

## 2023-08-21 NOTE — Progress Notes (Unsigned)
 HPI: Hector Armstrong is a 48 y.o. male who presents to the [facility] pharmacy clinic for Cabenuva  administration.  Patient Active Problem List   Diagnosis Date Noted   HIV disease (HCC) 08/10/2023   Health care maintenance 08/10/2023   Medication management 08/10/2023   Vaccine counseling 08/10/2023   Medication monitoring encounter 07/17/2016   Hereditary and idiopathic peripheral neuropathy 12/21/2015   Screening examination for venereal disease 11/19/2014   Encounter for long-term (current) use of medications 11/19/2014   Irritable bowel syndrome 10/10/2012   History of syphilis 06/19/2006   Human immunodeficiency virus (HIV) disease (HCC) 06/14/2006    Patient's Medications  New Prescriptions   No medications on file  Previous Medications   ATORVASTATIN  (LIPITOR) 40 MG TABLET    Take 1 tablet (40 mg total) by mouth daily.   CABOTEGRAVIR  & RILPIVIRINE  ER (CABENUVA ) 600 & 900 MG/3ML INJECTION    Inject 1 kit into the muscle every 30 (thirty) days.   DOLUTEGRAVIR -LAMIVUDINE  (DOVATO ) 50-300 MG TABLET    Take 1 tablet by mouth daily.  Modified Medications   No medications on file  Discontinued Medications   No medications on file    Allergies: No Known Allergies  Labs: Lab Results  Component Value Date   HIV1RNAQUANT NOT DETECTED 07/27/2023   HIV1RNAQUANT Not Detected 01/05/2023   HIV1RNAQUANT <20 (H) 11/08/2021   CD4TABS 565 11/08/2021   CD4TABS 633 05/10/2020   CD4TABS 570 07/04/2019    RPR and STI Lab Results  Component Value Date   LABRPR NON-REACTIVE 07/27/2023   LABRPR NON-REACTIVE 01/05/2023   LABRPR NON-REACTIVE 05/06/2021   LABRPR REACTIVE (A) 07/04/2019   LABRPR REACTIVE (A) 01/02/2018   RPRTITER 1:1 (H) 07/04/2019   RPRTITER 1:1 (H) 01/02/2018   RPRTITER 1:1 (H) 02/20/2017   RPRTITER 1:1 05/06/2015   RPRTITER 1:1 09/19/2012    STI Results GC CT  05/06/2021  4:12 PM Negative  Negative   07/04/2019  9:14 AM Negative  Negative   01/02/2018 12:00  AM Negative  Negative   06/27/2017 12:00 AM Negative  Negative   02/10/2017 12:00 AM Negative  Negative   06/23/2016 12:00 AM Negative  Negative     Hepatitis B Lab Results  Component Value Date   HEPBSAB NON-REACTIVE 08/10/2023   HEPBSAG NEG 06/14/2006   HEPBCAB POS (A) 06/14/2006   Hepatitis C Lab Results  Component Value Date   HEPCAB NON-REACTIVE 02/20/2017   Hepatitis A Lab Results  Component Value Date   HAV NEG 06/01/2011   Lipids: Lab Results  Component Value Date   CHOL 173 07/27/2023   TRIG 72 07/27/2023   HDL 77 07/27/2023   CHOLHDL 2.2 07/27/2023   VLDL 24 06/23/2016   LDLCALC 81 07/27/2023    Current HIV Regimen: Dovato   TARGET DATE: Today - the 6th  Assessment: Hector Armstrong presents today for his first initiation injection for Cabenuva . Counseled that Cabenuva  is two separate intramuscular injections in the gluteal muscle on each side for each visit. Explained that the second injection is 30 days after the initial injection then every 2 months thereafter. Discussed the rare but significant chance of developing resistance despite compliance. Explained that showing up to injection appointments is very important and warned that if 2 appointments are missed, it will be reassessed by their provider whether they are a good candidate for injection therapy. Counseled on possible side effects associated with the injections such as injection site pain, which is usually mild to moderate in nature, injection site nodules, and  injection site reactions. Asked to call the clinic or send me a mychart message if they experience any issues, such as fatigue, nausea, headache, rash, or dizziness. Advised that they can take ibuprofen  or tylenol for injection site pain if needed. Last HIV RNA was not detected on 07/27/23.   Administered cabotegravir  600mg /70mL in left upper outer quadrant of the gluteal muscle. Administered rilpivirine  900 mg/3mL in the right upper outer quadrant of the  gluteal muscle. Monitored patient for 10 minutes after injection. Injections were tolerated well without issue. Counseled to stop taking Dovato  after today's dose and to call with any issues that may arise. Will make follow up appointments for second initiation injection in 30 days and then maintenance injections every 2 months thereafter.   Plan: - Stop Dovato  after today's dose - First Cabenuva  injections administered - Second set of initiation injections scheduled for 09/20/23 - Maintenance injections scheduled for 11/22/23 - Call with any issues or questions  Hector Armstrong L. Keimon Basaldua, PharmD, BCIDP, AAHIVP, CPP Clinical Pharmacist Practitioner - Infectious Diseases Clinical Pharmacist Lead - Specialty Pharmacy [facility]

## 2023-08-21 NOTE — Telephone Encounter (Signed)
 [facility] Patient Advocate Encounter  Patient's medications Cabenuva  have been couriered to [facility] from [facility] and will be administered at the patients appointment on 08/22/23.  Arland Hutchinson, CPhT Specialty Pharmacy Patient [facility] for Infectious Disease Phone: [phone] Fax:  [phone]

## 2023-08-22 ENCOUNTER — Other Ambulatory Visit: Payer: Self-pay

## 2023-08-22 ENCOUNTER — Ambulatory Visit: Admitting: Pharmacist

## 2023-08-22 DIAGNOSIS — B20 Human immunodeficiency virus [HIV] disease: Secondary | ICD-10-CM | POA: Diagnosis not present

## 2023-08-22 MED ORDER — CABOTEGRAVIR & RILPIVIRINE ER 600 & 900 MG/3ML IM SUER
1.0000 | Freq: Once | INTRAMUSCULAR | Status: AC
  Administered 2023-08-22: 1 via INTRAMUSCULAR

## 2023-08-22 NOTE — Patient Instructions (Signed)
It was great seeing you today!  ° °You received the long-acting injectable, Cabenuva, today. Injection site reactions are common with the first few injections. They are generally mild to moderate in nature and only last a few days. For the first 2-3 injections, take an OTC pain medication, such as Motrin or Tylenol, within a couple hours before your injection and continue as needed for a few days. You may also apply a warm compress or heating pad to the injection site for 15-20 minutes after the injection. Please try not to rub the injection site as this can disrupt the medication pocket in your muscle.  ° °Other side effects, such as a nodule at the site of injection, pain, tenderness, swelling, or bruising can happen but are rare. Please let us know if you have any issues. ° °Cabenuva lasts a long time in your body but does not keep appropriate levels in your body for more than a few months. Please make all of your scheduled appointments and let us know ASAP if you have to miss an injection as you can develop resistant HIV virus or have a high viral load with missed injections. ° °Please send us a MyChart message if you have any questions. Have a great day! ° °

## 2023-08-27 ENCOUNTER — Other Ambulatory Visit: Payer: Self-pay

## 2023-08-29 ENCOUNTER — Emergency Department

## 2023-08-29 ENCOUNTER — Other Ambulatory Visit: Payer: Self-pay

## 2023-08-29 ENCOUNTER — Emergency Department
Admission: EM | Admit: 2023-08-29 | Discharge: 2023-08-29 | Disposition: A | Discharge status: Home / Self Care | Attending: Emergency Medicine | Admitting: Emergency Medicine

## 2023-08-29 DIAGNOSIS — G959 Disease of spinal cord, unspecified: Secondary | ICD-10-CM | POA: Diagnosis not present

## 2023-08-29 DIAGNOSIS — R0781 Pleurodynia: Secondary | ICD-10-CM | POA: Insufficient documentation

## 2023-08-29 DIAGNOSIS — Y92410 Unspecified street and highway as the place of occurrence of the external cause: Secondary | ICD-10-CM | POA: Insufficient documentation

## 2023-08-29 DIAGNOSIS — Z21 Asymptomatic human immunodeficiency virus [HIV] infection status: Secondary | ICD-10-CM | POA: Insufficient documentation

## 2023-08-29 DIAGNOSIS — S14109A Unspecified injury at unspecified level of cervical spinal cord, initial encounter: Secondary | ICD-10-CM | POA: Diagnosis not present

## 2023-08-29 DIAGNOSIS — M4802 Spinal stenosis, cervical region: Secondary | ICD-10-CM

## 2023-08-29 DIAGNOSIS — S199XXA Unspecified injury of neck, initial encounter: Secondary | ICD-10-CM | POA: Diagnosis present

## 2023-08-29 DIAGNOSIS — M5412 Radiculopathy, cervical region: Secondary | ICD-10-CM | POA: Diagnosis not present

## 2023-08-29 DIAGNOSIS — S12491A Other nondisplaced fracture of fifth cervical vertebra, initial encounter for closed fracture: Secondary | ICD-10-CM | POA: Diagnosis not present

## 2023-08-29 MED ORDER — KETOROLAC TROMETHAMINE 15 MG/ML IJ SOLN
15.0000 mg | Freq: Once | INTRAMUSCULAR | Status: AC
  Administered 2023-08-29 (×2): 15 mg via INTRAMUSCULAR
  Filled 2023-08-29: qty 1

## 2023-08-29 MED ORDER — CYCLOBENZAPRINE HCL 5 MG PO TABS: 5.0000 mg | ORAL_TABLET | Freq: Three times a day (TID) | ORAL | 1 refills | Status: DC | PRN

## 2023-08-29 MED ORDER — LIDOCAINE 5 % EX PTCH
1.0000 | MEDICATED_PATCH | CUTANEOUS | Status: DC
  Administered 2023-08-29 (×2): 1 via TRANSDERMAL
  Filled 2023-08-29: qty 1

## 2023-08-29 MED ORDER — ACETAMINOPHEN 325 MG PO TABS
650.0000 mg | ORAL_TABLET | Freq: Once | ORAL | Status: AC
  Administered 2023-08-29 (×2): 650 mg via ORAL
  Filled 2023-08-29: qty 2
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
## 2023-08-29 NOTE — ED Notes (Signed)
 Biomedical engineer applied. Fit of collar checked with PA prior to discharge. Discharge instructions, work note, and prescription discussed with pt and pt's SO at bedside. Pt assisted to vehicle via WC with all personal belongings.
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
## 2023-08-29 NOTE — Group Note (Deleted)
 Date:  08/29/2023 Time:  2:22 PM  Group Topic/Focus:  Wellness Toolbox:   The focus of this group is to discuss various aspects of wellness, balancing those aspects and exploring ways to increase the ability to experience wellness.  Patients will create a wellness toolbox for use upon discharge.     Participation Level:  {B[facility] PARTICIPATION OZCZO:77735}  Participation Quality:  {BHH PARTICIPATION QUALITY:22265}  Affect:  {BHH AFFECT:22266}  Cognitive:  {BHH COGNITIVE:22267}  Insight: {BHH Insight2:20797}  Engagement in Group:  {BHH ENGAGEMENT IN HMNLE:77731}  Modes of Intervention:  {B[facility] MODES OF INTERVENTION:22269}  Additional Comments:  ***  Myra Curtistine BROCKS 08/29/2023, 2:22 PM

## 2023-08-29 NOTE — Discharge Instructions (Addendum)
 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  This is causing some weakness in your left arm and some spasms in your lower legs.  Take the prescription muscle relaxants as needed.  Wear the neck brace when active and out of bed.  You may also need to sleep in the brace to help stabilize your neck, and prevent further nerve or spinal cord irritation.  Follow-up with Dr. Penne Sharps as scheduled.
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
## 2023-08-31 ENCOUNTER — Telehealth: Payer: Self-pay

## 2023-08-31 ENCOUNTER — Other Ambulatory Visit: Payer: Self-pay | Admitting: Neurosurgery

## 2023-08-31 DIAGNOSIS — S14105A Unspecified injury at C5 level of cervical spinal cord, initial encounter: Secondary | ICD-10-CM | POA: Insufficient documentation

## 2023-08-31 DIAGNOSIS — M5000 Cervical disc disorder with myelopathy, unspecified cervical region: Secondary | ICD-10-CM

## 2023-08-31 DIAGNOSIS — M4802 Spinal stenosis, cervical region: Secondary | ICD-10-CM

## 2023-08-31 NOTE — Telephone Encounter (Signed)
 I spoke with Dena (ref# [id number]) at [facility] on 08/31/23 and was informed that this patient's policy requires that he have surgery through a center of excellence such as [facility] in [place]. Per Dena, the surgery authorization would go through [facility] [phone]) and the inpatient stay would go through [facility] [phone]).  I contacted [facility] (spoke with Yancy) on 08/31/23 and inquired what would qualify for a network exception to have surgery with us . Per Yancy, traveling to a center of excellence would have to cause greater risk of paralysis or loss of life. She also stated that the turn around time for a network exception is up to 15 business days.The network exception must be initiated by the member, not the provider's office. If it is approved, we would then request authorization for the inpatient stay through [facility].   I notified Dr Claudene of the above. He asked that I place an urgent referral to [facility] in [place].  I spoke with the patient and explained the above. He inquired about filing the bill for surgery under car insurance. I informed him that I have been advised that this would mean he is self pay and his insurance would have to pay him back. However, I have sent a message to the pre-service center to confirm this and I am waiting on a response.    He is agreeable to the referral to [facility] in [place], but would like to continue with Dr Claudene if we find out that he can file through the car insurance without being considered self pay.  In the meantime, I have placed an urgent referral to [facility] in [place] and informed him that I will give him the phone # to [facility] so he can contact them to inquire about details regarding coverage for travel/lodging expenses to [place].

## 2023-09-03 ENCOUNTER — Other Ambulatory Visit: Payer: Self-pay

## 2023-09-03 DIAGNOSIS — S14105A Unspecified injury at C5 level of cervical spinal cord, initial encounter: Secondary | ICD-10-CM

## 2023-09-03 DIAGNOSIS — Z01818 Encounter for other preprocedural examination: Secondary | ICD-10-CM

## 2023-09-03 DIAGNOSIS — M4802 Spinal stenosis, cervical region: Secondary | ICD-10-CM

## 2023-09-03 NOTE — Telephone Encounter (Signed)
 Opt out form sent to patient via mychart. Surgeon fee estimate sent to patient electronically.  Left message for patient to return call to discuss anesthesia and monitoring company fees.

## 2023-09-03 NOTE — Telephone Encounter (Signed)
 Discussed with Dr Claudene. We will add him on for surgery on 09/18/23.

## 2023-09-03 NOTE — Telephone Encounter (Signed)
 Planned surgery: C4 Corpectomy, C3-5 Arthrodesis and cage placement, Graft Harvest, Anterior C3-5 Plating    Surgery date: 09/18/23 at [facility] (Medical Mall: [address] [place], [place] [address]) - you will find out your arrival time the business day before your surgery.   Pre-op appointment at [facility] Pre-admit Testing: you will receive a call with a date/time for this appointment. If you are scheduled for an in person appointment, Pre-admit Testing is located on the first floor of the [facility] building, [address], [address]. During this appointment, they will advise you which medications you can take the morning of surgery, and which medications you will need to hold for surgery. Labs (such as blood work, EKG) may be done at your pre-op appointment. You are not required to fast for these labs. Should you need to change your pre-op appointment, please call Pre-admit testing at [phone].      NSAIDS (Non-steroidal anti-inflammatory drugs): please avoid taking any NSAIDS (examples: ibuprofen , motrin , aleve , naproxen , meloxicam , diclofenac) for 3 months after surgery. Celebrex is an exception and is OK to take, if prescribed. Tylenol  is not an NSAID.    Common restrictions after spine surgery: No bending, lifting, or twisting ("BLT"). Avoid lifting objects heavier than 10 pounds for the first 6 weeks after surgery. Where possible, avoid household activities that involve lifting, bending, reaching, pushing, or pulling such as laundry, vacuuming, grocery shopping, and childcare. Try to arrange for help from friends and family for these activities while you heal. Do not drive while taking prescription pain medication. Weeks 6 through 12 after surgery: avoid lifting more than 25 pounds.    X-rays after surgery: for appointments after your 2 week follow-up: please arrive at the [facility] outpatient imaging center ([address]  [address], [place]) or [employer] one hour prior to your appointment for x-rays. This applies to every appointment after your 2 week follow-up. Failure to do so may result in your appointment being rescheduled. *We recently started construction to have x-ray in our office. This may be completed by the time you come in for your 6 week post-op appointment. Please check with us  closer to that time to see if you can have your x-rays at our office*    How to contact us :  If you have any questions/concerns before or after surgery, you can reach us  at [phone], or you can send a mychart message. We can be reached by phone or mychart 8am-4pm, Monday-Friday.  *Please note: Calls after 4pm are forwarded to a third party answering service. Mychart messages are not routinely monitored during evenings, weekends, and holidays. Please call our office to contact the answering service for urgent concerns during non-business hours.    If you have FMLA/disability paperwork, please drop it off or fax it to [phone]   Appointments/FMLA & disability paperwork: [employer] Registered Nurse/Surgery scheduler: Shakeyla Giebler, RN Certified Medical Assistants: Don, CMA, Elenor, CMA, & Damien, CMA Physician Assistants: Lyle Decamp, PA-C, Edsel Goods, PA-C & Glade Boys, PA-C Surgeons: Penne Sharps, MD & Reeves Daisy, MD   [facility] PREADMIT TESTING VISIT and SURGERY INFORMATION SHEET   Now that surgery has been scheduled you can anticipate several phone calls from [facility] services. A pharmacy technician will call you to verify your current list of medications taken at home.               The Pre-Service Center will call to  verify your insurance information and to give you billing estimates and information.             The Preadmit Testing Office will be calling to schedule a visit to obtain information for the anesthesia team and provide instructions  on preparation for surgery.  What can you expect for the Preadmit Testing Visit: Appointments may be scheduled in-person or by telephone.  If a telephone visit is scheduled, you may be asked to come into the office to have lab tests or other studies performed.   This visit will not be completed any greater than 14 days prior to your surgery.  If your surgery has been scheduled for a future date, please do not be alarmed if we have not contacted you to schedule an appointment more than a month prior to the surgery date.    Please be prepared to provide the following information during this appointment:            -Personal medical history                                               -Medication and allergy list            -Any history of problems with anesthesia              -Recent lab work or diagnostic studies            -Please notify us  of any needs we should be aware of to provide the best care possible           -You will be provided with instructions on how to prepare for your surgery.    On The Day of Surgery:  You must have a driver to take you home after surgery, you will be asked not to drive for 24 hours following surgery.  Taxi, Gisele and non-medical transport will not be acceptable means of transportation unless you have a responsible individual who will be traveling with you.  Visitors in the surgical area:   2 people will be able to visit you in your room once your preparation for surgery has been completed. During surgery, your visitors will be asked to wait in the Surgery Waiting Area.  It is not a requirement for them to stay, if they prefer to leave and come back.  Your visitor(s) will be given an update once the surgery has been completed.  No visitors are allowed in the initial recovery room to respect patient privacy and safety.  Once you are more awake and transfer to the secondary recovery area, or are transferred to an inpatient room, visitors will again be able to  see you.  To respect and protect your privacy: We will ask on the day of surgery who your driver will be and what the contact number for that individual will be. We will ask if it is okay to share information with this individual, or if there is an alternative individual that we, or the surgeon, should contact to provide updates and information. If family or friends come to the surgical information desk requesting information about you, who you have not listed with us , no information will be given.   It may be helpful to designate someone as the main contact who will be responsible for updating your other friends and family.    PREADMIT  TESTING OFFICE: [phone] SAME DAY SURGERY: [phone] We look forward to caring for you before and throughout the process of your surgery.

## 2023-09-03 NOTE — Telephone Encounter (Addendum)
 Patient called in to discuss options with our office. He states that he is unable to proceed with [facility] due to this being through 3rd party insurances and lawyers. I did inform the patient that if he would like to proceed, he will be self-pay and will have to discuss any back pay with his auto insurance outside of us . Patient is OK with this, he would like estimates sent on mychart so he can get an idea of what he is paying for.   Patient was advised to go to the ER if his pain continues to get worse.

## 2023-09-10 ENCOUNTER — Telehealth: Payer: Self-pay

## 2023-09-10 ENCOUNTER — Inpatient Hospital Stay
Admission: RE | Admit: 2023-09-10 | Discharge: 2023-09-10 | Disposition: A | Discharge status: Home / Self Care | Source: Ambulatory Visit

## 2023-09-10 HISTORY — DX: Spinal stenosis, cervical region: M48.02

## 2023-09-10 HISTORY — DX: Polyneuropathy, unspecified: G62.9

## 2023-09-10 HISTORY — DX: Personal history of other infectious and parasitic diseases: Z86.19

## 2023-09-10 HISTORY — DX: Unspecified injury at C5 level of cervical spinal cord, initial encounter: S14.105A

## 2023-09-10 HISTORY — DX: Irritable bowel syndrome, unspecified: K58.9
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
## 2023-09-10 NOTE — Telephone Encounter (Signed)
 I spoke with Hector Armstrong and he states they told him his surgery would be covered and approved by them. He did not have a name of who he spoke with or a reference #. We have not been contacted by them at this time.

## 2023-09-10 NOTE — Telephone Encounter (Signed)
 SABRA

## 2023-09-10 NOTE — Telephone Encounter (Signed)
 I contacted [employer] and spoke with Channing MATSU. Per discussion with Channing, they initially approved the out of network exception for a medical exception after the provider from [facility] in [place] called and informed them Mr Shevchenko is not an appropriate travel candidate . However, because Mr Lamarre has initiated contact with a lawyer, they now have sent things for higher level review to management. At this time, we must wait for management to review and discuss things, as his contact with a lawyer may void the medical exception. Per Channing, it is likely that they may require a letter from a lawyer stating that he is done working with Mr Flett before they would be willing to grant the exception, even if Mr Sales decided not to pursue using a Clinical research associate.  Channing stated she is not allowed to call home providers and we will need to call back in a few days for an update.  Channing suggested that Mr Inglett and I ask to speak with her when we call back, as she is familiar with his case.  I left a voicemail for Mr Xue stating that we are waiting for management to review his case.

## 2023-09-10 NOTE — Patient Instructions (Addendum)
 Your procedure is scheduled on:09-18-23 Tuesday Report to the Registration Desk on the 1st floor of the [facility].Then proceed to the 2nd floor Surgery Desk To find out your arrival time, please call [phone] between 1PM - 3PM on:09-14-23 Friday If your arrival time is 6:00 am, do not arrive before that time as the [facility] entrance doors do not open until 6:00 am.  REMEMBER: Instructions that are not followed completely may result in serious medical risk, up to and including death; or upon the discretion of your surgeon and anesthesiologist your surgery may need to be rescheduled.  Do not eat food after midnight the night before surgery.  No gum chewing or hard candies.  You may however, drink CLEAR liquids up to 2 hours before you are scheduled to arrive for your surgery. Do not drink anything within 2 hours of your scheduled arrival time.  Clear liquids include: - water  - apple juice without pulp - gatorade (not RED colors) - black coffee or tea (Do NOT add milk or creamers to the coffee or tea) Do NOT drink anything that is not on this list.  One week prior to surgery: Stop ANY OVER THE COUNTER supplements until after surgery.  Continue taking all of your other prescription medications up until the day of surgery.  ON THE DAY OF SURGERY ONLY TAKE THESE MEDICATIONS WITH SIPS OF WATER: -  No Alcohol for 24 hours before or after surgery.  No Smoking including e-cigarettes for 24 hours before surgery.  No chewable tobacco products for at least 6 hours before surgery.  No nicotine patches on the day of surgery.  Do not use any recreational drugs for at least a week (preferably 2 weeks) before your surgery.  Please be advised that the combination of cocaine and anesthesia may have negative outcomes, up to and including death. If you test positive for cocaine, your surgery will be cancelled.  On the morning of surgery brush your teeth with toothpaste and water, you may  rinse your mouth with mouthwash if you wish. Do not swallow any toothpaste or mouthwash.  Use CHG Soap as directed on instruction sheet.  Do not wear jewelry, make-up, hairpins, clips or nail polish.  For welded (permanent) jewelry: bracelets, anklets, waist bands, etc.  Please have this removed prior to surgery.  If it is not removed, there is a chance that hospital personnel will need to cut it off on the day of surgery.  Do not wear lotions, powders, or perfumes.   Do not shave body hair from the neck down 48 hours before surgery.  Contact lenses, hearing aids and dentures may not be worn into surgery.  Do not bring valuables to the hospital. [facility] is not responsible for any missing/lost belongings or valuables.   Notify your doctor if there is any change in your medical condition (cold, fever, infection).  Wear comfortable clothing (specific to your surgery type) to the hospital.  After surgery, you can help prevent lung complications by doing breathing exercises.  Take deep breaths and cough every 1-2 hours. Your doctor may order a device called an Incentive Spirometer to help you take deep breaths. When coughing or sneezing, hold a pillow firmly against your incision with both hands. This is called "splinting." Doing this helps protect your incision. It also decreases belly discomfort.  If you are being admitted to the hospital overnight, leave your suitcase in the car. After surgery it may be brought to your room.  In case of increased patient census, it may be necessary for you, the patient, to continue your postoperative care in the Same Day Surgery department.  If you are being discharged the day of surgery, you will not be allowed to drive home. You will need a responsible individual to drive you home and stay with you for 24 hours after surgery.   If you are taking public transportation, you will need to have a responsible individual with you.  Please call the  Pre-admissions Testing Dept. at [phone] if you have any questions about these instructions.  Surgery Visitation Policy:  Patients having surgery or a procedure may have two visitors.  Children under the age of 57 must have an adult with them who is not the patient.  Inpatient Visitation:    Visiting hours are 7 a.m. to 8 p.m. Up to four visitors are allowed at one time in a patient room. The visitors may rotate out with other people during the day.  One visitor age 3 or older may stay with the patient overnight and must be in the room by 8 p.m.    Pre-operative 5 CHG Bath Instructions   You can play a key role in reducing the risk of infection after surgery. Your skin needs to be as free of germs as possible. You can reduce the number of germs on your skin by washing with CHG (chlorhexidine gluconate) soap before surgery. CHG is an antiseptic soap that kills germs and continues to kill germs even after washing.   DO NOT use if you have an allergy to chlorhexidine/CHG or antibacterial soaps. If your skin becomes reddened or irritated, stop using the CHG and notify one of our RNs at [phone].   Please shower with the CHG soap starting 4 days before surgery using the following schedule:     Please keep in mind the following:  DO NOT shave, including legs and underarms, starting the day of your first shower.   You may shave your face at any point before/day of surgery.  Place clean sheets on your bed the day you start using CHG soap. Use a clean washcloth (not used since being washed) for each shower. DO NOT sleep with pets once you start using the CHG.   CHG Shower Instructions:  If you choose to wash your hair and private area, wash first with your normal shampoo/soap.  After you use shampoo/soap, rinse your hair and body thoroughly to remove shampoo/soap residue.  Turn the water OFF and apply about 3 tablespoons (45 ml) of CHG soap to a CLEAN washcloth.  Apply CHG soap  ONLY FROM YOUR NECK DOWN TO YOUR TOES (washing for 3-5 minutes)  DO NOT use CHG soap on face, private areas, open wounds, or sores.  Pay special attention to the area where your surgery is being performed.  If you are having back surgery, having someone wash your back for you may be helpful. Wait 2 minutes after CHG soap is applied, then you may rinse off the CHG soap.  Pat dry with a clean towel  Put on clean clothes/pajamas   If you choose to wear lotion, please use ONLY the CHG-compatible lotions on the back of this paper.     Additional instructions for the day of surgery: DO NOT APPLY any lotions, deodorants, cologne, or perfumes.   Put on clean/comfortable clothes.  Brush your teeth.  Ask your nurse before applying any prescription medications to the skin.  CHG Compatible Lotions   Aveeno Moisturizing lotion  Cetaphil Moisturizing Cream  Cetaphil Moisturizing Lotion  Clairol Herbal Essence Moisturizing Lotion, Dry Skin  Clairol Herbal Essence Moisturizing Lotion, Extra Dry Skin  Clairol Herbal Essence Moisturizing Lotion, Normal Skin  Curel Age Defying Therapeutic Moisturizing Lotion with Alpha Hydroxy  Curel Extreme Care Body Lotion  Curel Soothing Hands Moisturizing Hand Lotion  Curel Therapeutic Moisturizing Cream, Fragrance-Free  Curel Therapeutic Moisturizing Lotion, Fragrance-Free  Curel Therapeutic Moisturizing Lotion, Original Formula  Eucerin Daily Replenishing Lotion  Eucerin Dry Skin Therapy Plus Alpha Hydroxy Crme  Eucerin Dry Skin Therapy Plus Alpha Hydroxy Lotion  Eucerin Original Crme  Eucerin Original Lotion  Eucerin Plus Crme Eucerin Plus Lotion  Eucerin TriLipid Replenishing Lotion  Keri Anti-Bacterial Hand Lotion  Keri Deep Conditioning Original Lotion Dry Skin Formula Softly Scented  Keri Deep Conditioning Original Lotion, Fragrance Free Sensitive Skin Formula  Keri Lotion Fast Absorbing Fragrance Free Sensitive Skin Formula  Keri Lotion  Fast Absorbing Softly Scented Dry Skin Formula  Keri Original Lotion  Keri Skin Renewal Lotion Keri Silky Smooth Lotion  Keri Silky Smooth Sensitive Skin Lotion  Nivea Body Creamy Conditioning Oil  Nivea Body Extra Enriched Teacher, adult education Moisturizing Lotion Nivea Crme  Nivea Skin Firming Lotion  NutraDerm 30 Skin Lotion  NutraDerm Skin Lotion  NutraDerm Therapeutic Skin Cream  NutraDerm Therapeutic Skin Lotion  ProShield Protective Hand Cream  Provon moisturizing lotion   Merchandiser, retail to address health-related social needs:  [email or web address]

## 2023-09-12 ENCOUNTER — Other Ambulatory Visit (HOSPITAL_COMMUNITY): Payer: Self-pay

## 2023-09-12 ENCOUNTER — Other Ambulatory Visit: Payer: Self-pay

## 2023-09-12 NOTE — Progress Notes (Signed)
 Specialty Pharmacy Refill Coordination Note  Hector Armstrong is a 48 y.o. male assessed today regarding refills of clinic administered specialty medication(s) Cabotegravir  & Rilpivirine  (CABENUVA )   Clinic requested Courier to Provider Office   Delivery date: 09/18/23   Verified address: [address]   Medication will be filled on 09/14/23.

## 2023-09-14 ENCOUNTER — Other Ambulatory Visit: Payer: Self-pay

## 2023-09-14 NOTE — Telephone Encounter (Signed)
 I spoke with Hector Armstrong at [facility] to initiate a prior authorization. She took the details of the authorization request, but states they cannot process it until they have a copy of the exception letter. They will reach out to [employer] for a copy. If the member receives a copy, Hector asked that we forward it to them.   I spoke with Hector Armstrong about this. I explained that we can put his surgery back on the schedule for 10/04/23 (as long as [employer] approves it in time).  He states he needs his disability paperwork updated and some paperwork filled out for child support (because he is not working right now). He will bring this by on Tuesday.  We briefly discussed post-op restrictions and that he will likely be out of work for a minimum of 3 months, as he states that [employer] will not allow him to return to work light duty.

## 2023-09-14 NOTE — Telephone Encounter (Signed)
 I spoke with Hector Armstrong at [facility] and was informed that Hector Armstrong has been granted a Medical network exception. I was told to contact the health plan to initiate a prior authorization through them, as it is no longer handled by [facility] now that the exception has been granted. I asked for a copy of the exception letter, but was told they do not send this out to home' providers and one will be sent to the health plan.

## 2023-09-18 ENCOUNTER — Telehealth: Payer: Self-pay

## 2023-09-18 ENCOUNTER — Encounter: Admission: RE | Payer: Self-pay | Source: Home / Self Care

## 2023-09-18 ENCOUNTER — Inpatient Hospital Stay: Admission: RE | Admit: 2023-09-18 | Payer: Self-pay | Source: Home / Self Care | Admitting: Neurosurgery

## 2023-09-18 SURGERY — ANTERIOR CERVICAL CORPECTOMY
Anesthesia: General

## 2023-09-18 NOTE — Telephone Encounter (Signed)
 [facility] Patient Advocate Encounter  Patient's medications CABENUVA  have been couriered to [facility] from [facility] and will be administered at the patients appointment on 09/20/23.  Charmaine Sharps, CPhT Specialty Pharmacy Patient [facility] Phone: [phone] Fax:  [phone]

## 2023-09-20 ENCOUNTER — Ambulatory Visit: Admitting: Pharmacist

## 2023-09-27 ENCOUNTER — Encounter
Admission: RE | Admit: 2023-09-27 | Discharge: 2023-09-27 | Disposition: A | Discharge status: Home / Self Care | Source: Ambulatory Visit | Attending: Neurosurgery | Admitting: Neurosurgery

## 2023-09-27 ENCOUNTER — Ambulatory Visit: Admitting: Pharmacist

## 2023-09-27 ENCOUNTER — Other Ambulatory Visit: Payer: Self-pay

## 2023-09-27 VITALS — BP 163/102 | HR 64 | Resp 18 | Wt 134.7 lb

## 2023-09-27 DIAGNOSIS — M4802 Spinal stenosis, cervical region: Secondary | ICD-10-CM

## 2023-09-27 DIAGNOSIS — Z01812 Encounter for preprocedural laboratory examination: Secondary | ICD-10-CM | POA: Diagnosis present

## 2023-09-27 HISTORY — DX: Depression, unspecified: F32.A

## 2023-09-27 LAB — TYPE AND SCREEN
ABO/RH(D): O POS
Antibody Screen: NEGATIVE

## 2023-09-27 LAB — SURGICAL PCR SCREEN
MRSA, PCR: NEGATIVE
Staphylococcus aureus: NEGATIVE

## 2023-09-27 NOTE — Patient Instructions (Addendum)
 Your procedure is scheduled on: 10/04/23 - Thursday Report to the Registration Desk on the 1st floor of the [facility]. To find out your arrival time, please call [phone] between 1PM - 3PM on: 10/03/23 - Wednesday If your arrival time is 6:00 am, do not arrive before that time as the Medical Mall entrance doors do not open until 6:00 am.  REMEMBER: Instructions that are not followed completely may result in serious medical risk, up to and including death; or upon the discretion of your surgeon and anesthesiologist your surgery may need to be rescheduled.  Do not eat food after midnight the night before surgery.  No gum chewing or hard candies.  You may however, drink CLEAR liquids up to 2 hours before you are scheduled to arrive for your surgery. Do not drink anything within 2 hours of your scheduled arrival time.  Clear liquids include: - water  - apple juice without pulp - gatorade (not RED colors) - black coffee or tea (Do NOT add milk or creamers to the coffee or tea) Do NOT drink anything that is not on this list.   You may continue if needed on up until the day of surgery,  Anti-inflammatories (NSAIDS) such as Advil , Aleve , Ibuprofen , Motrin , Naproxen , Naprosyn  and Aspirin based products such as Excedrin, Goody's Powder, BC Powder. You may continue to take Tylenol  if needed for pain up until the day of surgery.  After surgey - NSAIDS (Non-steroidal anti-inflammatory drugs): please avoid taking any NSAIDS (examples: ibuprofen , motrin , aleve , naproxen , meloxicam , diclofenac) for 3 months after surgery. Celebrex is an exception and is OK to take, if prescribed. Tylenol  is not an NSAID.You may continue to take Tylenol  if needed for pain .   Stop ANY OVER THE COUNTER supplements until after surgery  :MULTIVITAMIN ADULTS 50+   ON THE DAY OF SURGERY ONLY TAKE THESE MEDICATIONS WITH SIPS OF WATER:   atorvastatin  (LIPITOR)    No Alcohol for 24 hours before or after  surgery.  No Smoking including e-cigarettes for 24 hours before surgery.  No chewable tobacco products for at least 6 hours before surgery.  No nicotine patches on the day of surgery.  Do not use any recreational drugs for at least a week (preferably 2 weeks) before your surgery.  Please be advised that the combination of cocaine and anesthesia may have negative outcomes, up to and including death. If you test positive for cocaine, your surgery will be cancelled.  On the morning of surgery brush your teeth with toothpaste and water, you may rinse your mouth with mouthwash if you wish. Do not swallow any toothpaste or mouthwash.  Use CHG Soap or wipes as directed on instruction sheet.  Do not wear jewelry, make-up, hairpins, clips or nail polish.  For welded (permanent) jewelry: bracelets, anklets, waist bands, etc.  Please have this removed prior to surgery.  If it is not removed, there is a chance that hospital personnel will need to cut it off on the day of surgery.  Do not wear lotions, powders, or perfumes.   Do not shave body hair from the neck down 48 hours before surgery.  Contact lenses, hearing aids and dentures may not be worn into surgery.  Do not bring valuables to the hospital. [facility] is not responsible for any missing/lost belongings or valuables.   Notify your doctor if there is any change in your medical condition (cold, fever, infection).  Wear comfortable clothing (specific to your surgery type) to the hospital.  After surgery, you can help prevent lung complications by doing breathing exercises.  Take deep breaths and cough every 1-2 hours. Your doctor may order a device called an Incentive Spirometer to help you take deep breaths.  When coughing or sneezing, hold a pillow firmly against your incision with both hands. This is called "splinting." Doing this helps protect your incision. It also decreases belly discomfort.  If you are being admitted to the  hospital overnight, leave your suitcase in the car. After surgery it may be brought to your room.  In case of increased patient census, it may be necessary for you, the patient, to continue your postoperative care in the Same Day Surgery department.  If you are being discharged the day of surgery, you will not be allowed to drive home. You will need a responsible individual to drive you home and stay with you for 24 hours after surgery.   If you are taking public transportation, you will need to have a responsible individual with you.  Please call the Pre-admissions Testing Dept. at [phone] if you have any questions about these instructions.  Surgery Visitation Policy:  Patients having surgery or a procedure may have two visitors.  Children under the age of 11 must have an adult with them who is not the patient.  Inpatient Visitation:    Visiting hours are 7 a.m. to 8 p.m. Up to four visitors are allowed at one time in a patient room. The visitors may rotate out with other people during the day.  One visitor age 54 or older may stay with the patient overnight and must be in the room by 8 p.m.   Merchandiser, retail to address health-related social needs:  [email or web address]    Pre-operative 5 CHG Bath Instructions   You can play a key role in reducing the risk of infection after surgery. Your skin needs to be as free of germs as possible. You can reduce the number of germs on your skin by washing with CHG (chlorhexidine gluconate) soap before surgery. CHG is an antiseptic soap that kills germs and continues to kill germs even after washing.   DO NOT use if you have an allergy to chlorhexidine/CHG or antibacterial soaps. If your skin becomes reddened or irritated, stop using the CHG and notify one of our RNs at [phone].   Please shower with the CHG soap starting 4 days before surgery using the following schedule: 09/14 - 09/18.    Please keep in  mind the following:  DO NOT shave, including legs and underarms, starting the day of your first shower.   You may shave your face at any point before/day of surgery.  Place clean sheets on your bed the day you start using CHG soap. Use a clean washcloth (not used since being washed) for each shower. DO NOT sleep with pets once you start using the CHG.   CHG Shower Instructions:  If you choose to wash your hair and private area, wash first with your normal shampoo/soap.  After you use shampoo/soap, rinse your hair and body thoroughly to remove shampoo/soap residue.  Turn the water OFF and apply about 3 tablespoons (45 ml) of CHG soap to a CLEAN washcloth.  Apply CHG soap ONLY FROM YOUR NECK DOWN TO YOUR TOES (washing for 3-5 minutes)  DO NOT use CHG soap on face, private areas, open wounds, or sores.  Pay special attention to the area where your surgery is being performed.  If you are having  back surgery, having someone wash your back for you may be helpful. Wait 2 minutes after CHG soap is applied, then you may rinse off the CHG soap.  Pat dry with a clean towel  Put on clean clothes/pajamas   If you choose to wear lotion, please use ONLY the CHG-compatible lotions on the back of this paper.     Additional instructions for the day of surgery: DO NOT APPLY any lotions, deodorants, cologne, or perfumes.   Put on clean/comfortable clothes.  Brush your teeth.  Ask your nurse before applying any prescription medications to the skin.      CHG Compatible Lotions   Aveeno Moisturizing lotion  Cetaphil Moisturizing Cream  Cetaphil Moisturizing Lotion  Clairol Herbal Essence Moisturizing Lotion, Dry Skin  Clairol Herbal Essence Moisturizing Lotion, Extra Dry Skin  Clairol Herbal Essence Moisturizing Lotion, Normal Skin  Curel Age Defying Therapeutic Moisturizing Lotion with Alpha Hydroxy  Curel Extreme Care Body Lotion  Curel Soothing Hands Moisturizing Hand Lotion  Curel Therapeutic  Moisturizing Cream, Fragrance-Free  Curel Therapeutic Moisturizing Lotion, Fragrance-Free  Curel Therapeutic Moisturizing Lotion, Original Formula  Eucerin Daily Replenishing Lotion  Eucerin Dry Skin Therapy Plus Alpha Hydroxy Crme  Eucerin Dry Skin Therapy Plus Alpha Hydroxy Lotion  Eucerin Original Crme  Eucerin Original Lotion  Eucerin Plus Crme Eucerin Plus Lotion  Eucerin TriLipid Replenishing Lotion  Keri Anti-Bacterial Hand Lotion  Keri Deep Conditioning Original Lotion Dry Skin Formula Softly Scented  Keri Deep Conditioning Original Lotion, Fragrance Free Sensitive Skin Formula  Keri Lotion Fast Absorbing Fragrance Free Sensitive Skin Formula  Keri Lotion Fast Absorbing Softly Scented Dry Skin Formula  Keri Original Lotion  Keri Skin Renewal Lotion Keri Silky Smooth Lotion  Keri Silky Smooth Sensitive Skin Lotion  Nivea Body Creamy Conditioning Oil  Nivea Body Extra Enriched Lotion  Nivea Body Original Lotion  Nivea Body Sheer Moisturizing Lotion Nivea Crme  Nivea Skin Firming Lotion  NutraDerm 30 Skin Lotion  NutraDerm Skin Lotion  NutraDerm Therapeutic Skin Cream  NutraDerm Therapeutic Skin Lotion  ProShield Protective Hand Cream  Provon moisturizing lotion                                                                                                             How to Use an Incentive Spirometer  An incentive spirometer is a tool that measures how well you are filling your lungs with each breath. Learning to take long, deep breaths using this tool can help you keep your lungs clear and active. This may help to reverse or lessen your chance of developing breathing (pulmonary) problems, especially infection. You may be asked to use a spirometer: After a surgery. If you have a lung problem or a history of smoking. After a long period of time when you have been unable to move or be active. If the spirometer includes an indicator to show the highest number that you  have reached, your health care provider or respiratory therapist will help you set a goal. Keep a log of your progress as  told by your health care provider. What are the risks? Breathing too quickly may cause dizziness or cause you to pass out. Take your time so you do not get dizzy or light-headed. If you are in pain, you may need to take pain medicine before doing incentive spirometry. It is harder to take a deep breath if you are having pain. How to use your incentive spirometer  Sit up on the edge of your bed or on a chair. Hold the incentive spirometer so that it is in an upright position. Before you use the spirometer, breathe out normally. Place the mouthpiece in your mouth. Make sure your lips are closed tightly around it. Breathe in slowly and as deeply as you can through your mouth, causing the piston or the ball to rise toward the top of the chamber. Hold your breath for 3-5 seconds, or for as long as possible. If the spirometer includes a coach indicator, use this to guide you in breathing. Slow down your breathing if the indicator goes above the marked areas. Remove the mouthpiece from your mouth and breathe out normally. The piston or ball will return to the bottom of the chamber. Rest for a few seconds, then repeat the steps 10 or more times. Take your time and take a few normal breaths between deep breaths so that you do not get dizzy or light-headed. Do this every 1-2 hours when you are awake. If the spirometer includes a goal marker to show the highest number you have reached (best effort), use this as a goal to work toward during each repetition. After each set of 10 deep breaths, cough a few times. This will help to make sure that your lungs are clear. If you have an incision on your chest or abdomen from surgery, place a pillow or a rolled-up towel firmly against the incision when you cough. This can help to reduce pain while taking deep breaths and coughing. General  tips When you are able to get out of bed: Walk around often. Continue to take deep breaths and cough in order to clear your lungs. Keep using the incentive spirometer until your health care provider says it is okay to stop using it. If you have been in the hospital, you may be told to keep using the spirometer at home. Contact a health care provider if: You are having difficulty using the spirometer. You have trouble using the spirometer as often as instructed. Your pain medicine is not giving enough relief for you to use the spirometer as told. You have a fever. Get help right away if: You develop shortness of breath. You develop a cough with bloody mucus from the lungs. You have fluid or blood coming from an incision site after you cough. Summary An incentive spirometer is a tool that can help you learn to take long, deep breaths to keep your lungs clear and active. You may be asked to use a spirometer after a surgery, if you have a lung problem or a history of smoking, or if you have been inactive for a long period of time. Use your incentive spirometer as instructed every 1-2 hours while you are awake. If you have an incision on your chest or abdomen, place a pillow or a rolled-up towel firmly against your incision when you cough. This will help to reduce pain. Get help right away if you have shortness of breath, you cough up bloody mucus, or blood comes from your incision when you cough. This information  is not intended to replace advice given to you by your health care provider. Make sure you discuss any questions you have with your health care provider. Document Revised: 03/24/2019 Document Reviewed: 03/24/2019 Elsevier Patient Education  2023 [employer].

## 2023-09-27 NOTE — Progress Notes (Unsigned)
 HPI: Hector Armstrong is a 48 y.o. male who presents to the [facility] pharmacy clinic for Cabenuva  administration.  Patient Active Problem List   Diagnosis Date Noted   Spinal cord injury at C5-C7 level without injury of spinal bone (HCC) 08/31/2023   Spinal stenosis in cervical region 08/31/2023   Intervertebral cervical disc disorder with myelopathy, cervical region 08/31/2023   HIV disease (HCC) 08/10/2023   Health care maintenance 08/10/2023   Medication management 08/10/2023   Vaccine counseling 08/10/2023   Medication monitoring encounter 07/17/2016   Hereditary and idiopathic peripheral neuropathy 12/21/2015   Screening examination for venereal disease 11/19/2014   Encounter for long-term (current) use of medications 11/19/2014   Irritable bowel syndrome 10/10/2012   History of syphilis 06/19/2006   Human immunodeficiency virus (HIV) disease (HCC) 06/14/2006    Patient's Medications  New Prescriptions   No medications on file  Previous Medications   ATORVASTATIN  (LIPITOR) 40 MG TABLET    Take 1 tablet (40 mg total) by mouth daily.   CABOTEGRAVIR  & RILPIVIRINE  ER (CABENUVA ) 600 & 900 MG/3ML INJECTION    Inject 1 kit into the muscle every 30 (thirty) days.   CYCLOBENZAPRINE  (FLEXERIL ) 5 MG TABLET    Take 1 tablet (5 mg total) by mouth 3 (three) times daily as needed.   MULTIPLE VITAMINS-MINERALS (MULTIVITAMIN ADULTS 50+ PO)    Take by mouth daily.  Modified Medications   No medications on file  Discontinued Medications   No medications on file    Allergies: No Known Allergies  Labs: Lab Results  Component Value Date   HIV1RNAQUANT NOT DETECTED 07/27/2023   HIV1RNAQUANT Not Detected 01/05/2023   HIV1RNAQUANT <20 (H) 11/08/2021   CD4TABS 565 11/08/2021   CD4TABS 633 05/10/2020   CD4TABS 570 07/04/2019    RPR and STI Lab Results  Component Value Date   LABRPR NON-REACTIVE 07/27/2023   LABRPR NON-REACTIVE 01/05/2023   LABRPR NON-REACTIVE 05/06/2021   LABRPR  REACTIVE (A) 07/04/2019   LABRPR REACTIVE (A) 01/02/2018   RPRTITER 1:1 (H) 07/04/2019   RPRTITER 1:1 (H) 01/02/2018   RPRTITER 1:1 (H) 02/20/2017   RPRTITER 1:1 05/06/2015   RPRTITER 1:1 09/19/2012    STI Results GC CT  05/06/2021  4:12 PM Negative  Negative   07/04/2019  9:14 AM Negative  Negative   01/02/2018 12:00 AM Negative  Negative   06/27/2017 12:00 AM Negative  Negative   02/10/2017 12:00 AM Negative  Negative   06/23/2016 12:00 AM Negative  Negative     Hepatitis B Lab Results  Component Value Date   HEPBSAB NON-REACTIVE 08/10/2023   HEPBSAG NEG 06/14/2006   HEPBCAB POS (A) 06/14/2006   Hepatitis C Lab Results  Component Value Date   HEPCAB NON-REACTIVE 02/20/2017   Hepatitis A Lab Results  Component Value Date   HAV NEG 06/01/2011   Lipids: Lab Results  Component Value Date   CHOL 173 07/27/2023   TRIG 72 07/27/2023   HDL 77 07/27/2023   CHOLHDL 2.2 07/27/2023   VLDL 24 06/23/2016   LDLCALC 81 07/27/2023    TARGET DATE: The 6th  Assessment: Hector Armstrong presents today for his maintenance Cabenuva  injections. Past injections were tolerated well without issues. Last HIV RNA was undetectable in July. Doing well with no issues today.  Administered cabotegravir  600mg /46mL in left upper outer quadrant of the gluteal muscle. Administered rilpivirine  900 mg/3mL in the right upper outer quadrant of the gluteal muscle. No issues with injections. He will follow up in 2 months  for next set of injections.  Flu, shingrix, meningo booster, HepA+HepB (Twinrix) 0, 1, 6 mo  -Cab -STI testing -Vax -Schedule for Jan  Plan: - Cabenuva  injections administered - Next injections scheduled for 11/22/23 with Hector Armstrong and _____ - Call with any issues or questions  Hector Armstrong, PharmD PGY1 Pharmacy Resident [facility]  09/27/2023 6:47 PM

## 2023-09-28 ENCOUNTER — Ambulatory Visit: Admitting: Pharmacist

## 2023-09-28 DIAGNOSIS — Z21 Asymptomatic human immunodeficiency virus [HIV] infection status: Secondary | ICD-10-CM

## 2023-09-28 DIAGNOSIS — Z113 Encounter for screening for infections with a predominantly sexual mode of transmission: Secondary | ICD-10-CM

## 2023-09-28 DIAGNOSIS — Z23 Encounter for immunization: Secondary | ICD-10-CM
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
## 2023-10-02 ENCOUNTER — Encounter: Admitting: Physician Assistant

## 2023-10-02 ENCOUNTER — Other Ambulatory Visit: Payer: Self-pay | Admitting: Pharmacist

## 2023-10-02 ENCOUNTER — Ambulatory Visit: Admitting: Pharmacist

## 2023-10-02 ENCOUNTER — Other Ambulatory Visit: Payer: Self-pay

## 2023-10-02 DIAGNOSIS — B20 Human immunodeficiency virus [HIV] disease: Secondary | ICD-10-CM

## 2023-10-02 DIAGNOSIS — Z0000 Encounter for general adult medical examination without abnormal findings: Secondary | ICD-10-CM

## 2023-10-02 DIAGNOSIS — Z23 Encounter for immunization: Secondary | ICD-10-CM | POA: Diagnosis not present

## 2023-10-02 MED ORDER — CABOTEGRAVIR & RILPIVIRINE ER 600 & 900 MG/3ML IM SUER
1.0000 | Freq: Once | INTRAMUSCULAR | Status: AC
  Administered 2023-10-02: 1 via INTRAMUSCULAR

## 2023-10-04 ENCOUNTER — Encounter
Admission: RE | Disposition: A | Discharge status: Home / Self Care | Payer: Self-pay | Source: Ambulatory Visit | Attending: Neurosurgery

## 2023-10-04 ENCOUNTER — Other Ambulatory Visit: Payer: Self-pay

## 2023-10-04 ENCOUNTER — Encounter: Payer: Self-pay | Admitting: Neurosurgery

## 2023-10-04 ENCOUNTER — Inpatient Hospital Stay: Admitting: General Practice

## 2023-10-04 ENCOUNTER — Inpatient Hospital Stay
Admission: RE | Admit: 2023-10-04 | Discharge: 2023-10-09 | DRG: 429 | Disposition: A | Discharge status: Home / Self Care | Source: Ambulatory Visit | Attending: Neurosurgery | Admitting: Neurosurgery

## 2023-10-04 ENCOUNTER — Inpatient Hospital Stay

## 2023-10-04 DIAGNOSIS — Z21 Asymptomatic human immunodeficiency virus [HIV] infection status: Secondary | ICD-10-CM | POA: Diagnosis present

## 2023-10-04 DIAGNOSIS — G992 Myelopathy in diseases classified elsewhere: Secondary | ICD-10-CM | POA: Diagnosis present

## 2023-10-04 DIAGNOSIS — S14124A Central cord syndrome at C4 level of cervical spinal cord, initial encounter: Secondary | ICD-10-CM | POA: Diagnosis present

## 2023-10-04 DIAGNOSIS — M4802 Spinal stenosis, cervical region: Secondary | ICD-10-CM | POA: Diagnosis present

## 2023-10-04 DIAGNOSIS — R1310 Dysphagia, unspecified: Secondary | ICD-10-CM | POA: Diagnosis not present

## 2023-10-04 DIAGNOSIS — S13151A Dislocation of C4/C5 cervical vertebrae, initial encounter: Secondary | ICD-10-CM | POA: Diagnosis present

## 2023-10-04 DIAGNOSIS — S14104A Unspecified injury at C4 level of cervical spinal cord, initial encounter: Secondary | ICD-10-CM

## 2023-10-04 DIAGNOSIS — S14105A Unspecified injury at C5 level of cervical spinal cord, initial encounter: Secondary | ICD-10-CM | POA: Diagnosis present

## 2023-10-04 DIAGNOSIS — B20 Human immunodeficiency virus [HIV] disease: Secondary | ICD-10-CM | POA: Diagnosis present

## 2023-10-04 DIAGNOSIS — M2578 Osteophyte, vertebrae: Secondary | ICD-10-CM | POA: Diagnosis present

## 2023-10-04 DIAGNOSIS — Z01818 Encounter for other preprocedural examination: Secondary | ICD-10-CM

## 2023-10-04 DIAGNOSIS — G629 Polyneuropathy, unspecified: Secondary | ICD-10-CM | POA: Diagnosis present

## 2023-10-04 DIAGNOSIS — K589 Irritable bowel syndrome without diarrhea: Secondary | ICD-10-CM | POA: Diagnosis present

## 2023-10-04 DIAGNOSIS — G959 Disease of spinal cord, unspecified: Principal | ICD-10-CM | POA: Diagnosis present

## 2023-10-04 HISTORY — PX: ANTERIOR CERVICAL CORPECTOMY: SHX1159

## 2023-10-04 LAB — ABO/RH: ABO/RH(D): O POS

## 2023-10-04 SURGERY — ANTERIOR CERVICAL CORPECTOMY
Anesthesia: General

## 2023-10-04 MED ORDER — DEXMEDETOMIDINE HCL IN NACL 80 MCG/20ML IV SOLN
INTRAVENOUS | Status: DC | PRN
Start: 2023-10-04 — End: 2023-10-04
  Administered 2023-10-04: 8 ug via INTRAVENOUS

## 2023-10-04 MED ORDER — GLYCOPYRROLATE 0.2 MG/ML IJ SOLN
INTRAMUSCULAR | Status: AC
  Filled 2023-10-04: qty 1

## 2023-10-04 MED ORDER — FENTANYL CITRATE (PF) 100 MCG/2ML IJ SOLN
25.0000 ug | INTRAMUSCULAR | Status: DC | PRN
  Administered 2023-10-04 (×2): 25 ug via INTRAVENOUS

## 2023-10-04 MED ORDER — LIDOCAINE HCL (CARDIAC) PF 100 MG/5ML IV SOSY
PREFILLED_SYRINGE | INTRAVENOUS | Status: DC | PRN
  Administered 2023-10-04: 100 mg via INTRAVENOUS

## 2023-10-04 MED ORDER — SODIUM CHLORIDE 0.9 % IV SOLN: 250.0000 mL | INTRAVENOUS | Status: AC

## 2023-10-04 MED ORDER — SUCCINYLCHOLINE CHLORIDE 200 MG/10ML IV SOSY
PREFILLED_SYRINGE | INTRAVENOUS | Status: AC
Start: 2023-10-04 — End: 2023-10-04
  Filled 2023-10-04: qty 10

## 2023-10-04 MED ORDER — PROPOFOL 500 MG/50ML IV EMUL
INTRAVENOUS | Status: DC | PRN
  Administered 2023-10-04: 150 ug/kg/min via INTRAVENOUS

## 2023-10-04 MED ORDER — MENTHOL 3 MG MT LOZG
1.0000 | LOZENGE | OROMUCOSAL | Status: DC | PRN
  Administered 2023-10-06: 3 mg via ORAL
  Filled 2023-10-04: qty 9

## 2023-10-04 MED ORDER — PROPOFOL 10 MG/ML IV BOLUS
INTRAVENOUS | Status: AC
  Filled 2023-10-04: qty 40

## 2023-10-04 MED ORDER — ACETAMINOPHEN 10 MG/ML IV SOLN
INTRAVENOUS | Status: AC
  Filled 2023-10-04: qty 100

## 2023-10-04 MED ORDER — FENTANYL CITRATE (PF) 100 MCG/2ML IJ SOLN
INTRAMUSCULAR | Status: DC | PRN
  Administered 2023-10-04 (×2): 50 ug via INTRAVENOUS

## 2023-10-04 MED ORDER — CEFAZOLIN IN SODIUM CHLORIDE 2-0.9 GM/100ML-% IV SOLN
2.0000 g | Freq: Once | INTRAVENOUS | Status: AC
  Administered 2023-10-04: 2 g via INTRAVENOUS

## 2023-10-04 MED ORDER — KETAMINE HCL 10 MG/ML IJ SOLN
INTRAMUSCULAR | Status: DC | PRN
Start: 2023-10-04 — End: 2023-10-04
  Administered 2023-10-04 (×2): 25 mg via INTRAVENOUS

## 2023-10-04 MED ORDER — METHOCARBAMOL 500 MG PO TABS
500.0000 mg | ORAL_TABLET | Freq: Four times a day (QID) | ORAL | Status: DC | PRN
  Administered 2023-10-05 – 2023-10-06 (×6): 500 mg via ORAL
  Filled 2023-10-04 (×6): qty 1

## 2023-10-04 MED ORDER — PHENOL 1.4 % MT LIQD: 1.0000 | OROMUCOSAL | Status: DC | PRN

## 2023-10-04 MED ORDER — REMIFENTANIL HCL 1 MG IV SOLR
INTRAVENOUS | Status: AC
  Filled 2023-10-04: qty 1000

## 2023-10-04 MED ORDER — LACTATED RINGERS IV SOLN: INTRAVENOUS | Status: DC | PRN

## 2023-10-04 MED ORDER — HYDROMORPHONE HCL 1 MG/ML IJ SOLN
INTRAMUSCULAR | Status: DC | PRN
  Administered 2023-10-04: 1 mg via INTRAVENOUS

## 2023-10-04 MED ORDER — ACETAMINOPHEN 325 MG PO TABS: 650.0000 mg | ORAL_TABLET | ORAL | Status: DC | PRN

## 2023-10-04 MED ORDER — FENTANYL CITRATE (PF) 100 MCG/2ML IJ SOLN
INTRAMUSCULAR | Status: AC
  Filled 2023-10-04: qty 2

## 2023-10-04 MED ORDER — IRRISEPT - 450ML BOTTLE WITH 0.05% CHG IN STERILE WATER, USP 99.95% OPTIME
TOPICAL | Status: DC | PRN
  Administered 2023-10-04: 450 mL via TOPICAL

## 2023-10-04 MED ORDER — SODIUM CHLORIDE 0.9% FLUSH
3.0000 mL | Freq: Two times a day (BID) | INTRAVENOUS | Status: DC
  Administered 2023-10-04 – 2023-10-09 (×10): 3 mL via INTRAVENOUS

## 2023-10-04 MED ORDER — GLYCOPYRROLATE 0.2 MG/ML IJ SOLN
INTRAMUSCULAR | Status: DC | PRN
  Administered 2023-10-04: .2 mg via INTRAVENOUS

## 2023-10-04 MED ORDER — PHENYLEPHRINE HCL-NACL 20-0.9 MG/250ML-% IV SOLN
INTRAVENOUS | Status: DC | PRN
  Administered 2023-10-04: 20 ug/min via INTRAVENOUS

## 2023-10-04 MED ORDER — SENNA 8.6 MG PO TABS
1.0000 | ORAL_TABLET | Freq: Two times a day (BID) | ORAL | Status: DC
  Administered 2023-10-04 – 2023-10-07 (×5): 8.6 mg via ORAL
  Filled 2023-10-04 (×9): qty 1

## 2023-10-04 MED ORDER — SODIUM CHLORIDE 0.9% FLUSH: 3.0000 mL | INTRAVENOUS | Status: DC | PRN

## 2023-10-04 MED ORDER — PROPOFOL 1000 MG/100ML IV EMUL
INTRAVENOUS | Status: AC
  Filled 2023-10-04: qty 100

## 2023-10-04 MED ORDER — SODIUM CHLORIDE 0.9 % IV SOLN
INTRAVENOUS | Status: DC | PRN
  Administered 2023-10-04: .2 ug/kg/min via INTRAVENOUS

## 2023-10-04 MED ORDER — KETAMINE HCL 50 MG/5ML IJ SOSY
PREFILLED_SYRINGE | INTRAMUSCULAR | Status: AC
  Filled 2023-10-04: qty 5

## 2023-10-04 MED ORDER — MIDAZOLAM HCL 2 MG/2ML IJ SOLN
INTRAMUSCULAR | Status: DC | PRN
  Administered 2023-10-04: 2 mg via INTRAVENOUS

## 2023-10-04 MED ORDER — OXYCODONE HCL 5 MG PO TABS
5.0000 mg | ORAL_TABLET | ORAL | Status: DC | PRN
  Administered 2023-10-07 – 2023-10-08 (×2): 5 mg via ORAL
  Filled 2023-10-04 (×2): qty 1

## 2023-10-04 MED ORDER — ACETAMINOPHEN 650 MG RE SUPP: 650.0000 mg | RECTAL | Status: DC | PRN

## 2023-10-04 MED ORDER — MIDAZOLAM HCL 2 MG/2ML IJ SOLN
INTRAMUSCULAR | Status: AC
  Filled 2023-10-04: qty 2

## 2023-10-04 MED ORDER — ONDANSETRON HCL 4 MG/2ML IJ SOLN: 4.0000 mg | Freq: Four times a day (QID) | INTRAMUSCULAR | Status: DC | PRN

## 2023-10-04 MED ORDER — SUCCINYLCHOLINE CHLORIDE 200 MG/10ML IV SOSY
PREFILLED_SYRINGE | INTRAVENOUS | Status: DC | PRN
  Administered 2023-10-04: 100 mg via INTRAVENOUS

## 2023-10-04 MED ORDER — BUPIVACAINE-EPINEPHRINE (PF) 0.5% -1:200000 IJ SOLN
INTRAMUSCULAR | Status: DC | PRN
  Administered 2023-10-04: 4 mL via PERINEURAL

## 2023-10-04 MED ORDER — ALBUMIN HUMAN 5 % IV SOLN: INTRAVENOUS | Status: DC | PRN

## 2023-10-04 MED ORDER — ORAL CARE MOUTH RINSE: 15.0000 mL | Freq: Once | OROMUCOSAL | Status: AC

## 2023-10-04 MED ORDER — POLYETHYLENE GLYCOL 3350 17 G PO PACK: 17.0000 g | PACK | Freq: Every day | ORAL | Status: DC | PRN

## 2023-10-04 MED ORDER — LACTATED RINGERS IV SOLN: INTRAVENOUS | Status: DC

## 2023-10-04 MED ORDER — PHENYLEPHRINE HCL (PRESSORS) 10 MG/ML IV SOLN
INTRAVENOUS | Status: AC
  Filled 2023-10-04: qty 1

## 2023-10-04 MED ORDER — OXYCODONE HCL 5 MG PO TABS: 5.0000 mg | ORAL_TABLET | Freq: Once | ORAL | Status: DC | PRN

## 2023-10-04 MED ORDER — ENOXAPARIN SODIUM 40 MG/0.4ML IJ SOSY
40.0000 mg | PREFILLED_SYRINGE | INTRAMUSCULAR | Status: DC
  Administered 2023-10-06 – 2023-10-07 (×2): 40 mg via SUBCUTANEOUS
  Filled 2023-10-04 (×4): qty 0.4

## 2023-10-04 MED ORDER — MAGNESIUM CITRATE PO SOLN: 1.0000 | Freq: Once | ORAL | Status: DC | PRN

## 2023-10-04 MED ORDER — ALBUMIN HUMAN 5 % IV SOLN
INTRAVENOUS | Status: AC
  Filled 2023-10-04: qty 250

## 2023-10-04 MED ORDER — ONDANSETRON HCL 4 MG/2ML IJ SOLN
INTRAMUSCULAR | Status: DC | PRN
  Administered 2023-10-04: 4 mg via INTRAVENOUS

## 2023-10-04 MED ORDER — OXYCODONE HCL 5 MG/5ML PO SOLN: 5.0000 mg | Freq: Once | ORAL | Status: DC | PRN

## 2023-10-04 MED ORDER — ONDANSETRON HCL 4 MG PO TABS: 4.0000 mg | ORAL_TABLET | Freq: Four times a day (QID) | ORAL | Status: DC | PRN

## 2023-10-04 MED ORDER — ACETAMINOPHEN 500 MG PO TABS
1000.0000 mg | ORAL_TABLET | Freq: Four times a day (QID) | ORAL | Status: AC
  Filled 2023-10-04: qty 2

## 2023-10-04 MED ORDER — BISACODYL 5 MG PO TBEC: 5.0000 mg | DELAYED_RELEASE_TABLET | Freq: Every day | ORAL | Status: DC | PRN

## 2023-10-04 MED ORDER — PROPOFOL 10 MG/ML IV BOLUS
INTRAVENOUS | Status: DC | PRN
  Administered 2023-10-04: 150 mg via INTRAVENOUS
  Administered 2023-10-04: 50 mg via INTRAVENOUS

## 2023-10-04 MED ORDER — ATORVASTATIN CALCIUM 20 MG PO TABS
40.0000 mg | ORAL_TABLET | Freq: Every day | ORAL | Status: DC
  Administered 2023-10-04 – 2023-10-08 (×5): 40 mg via ORAL
  Filled 2023-10-04 (×5): qty 2

## 2023-10-04 MED ORDER — ONDANSETRON HCL 4 MG/2ML IJ SOLN
INTRAMUSCULAR | Status: AC
  Filled 2023-10-04: qty 2

## 2023-10-04 MED ORDER — DEXAMETHASONE SODIUM PHOSPHATE 10 MG/ML IJ SOLN
INTRAMUSCULAR | Status: AC
  Filled 2023-10-04: qty 1

## 2023-10-04 MED ORDER — DEXAMETHASONE SODIUM PHOSPHATE 10 MG/ML IJ SOLN
INTRAMUSCULAR | Status: DC | PRN
  Administered 2023-10-04: 10 mg via INTRAVENOUS

## 2023-10-04 MED ORDER — HYDROMORPHONE HCL 1 MG/ML IJ SOLN
INTRAMUSCULAR | Status: AC
  Filled 2023-10-04: qty 1

## 2023-10-04 MED ORDER — METHOCARBAMOL 1000 MG/10ML IJ SOLN
500.0000 mg | Freq: Four times a day (QID) | INTRAMUSCULAR | Status: DC | PRN
  Administered 2023-10-08: 500 mg via INTRAVENOUS
  Filled 2023-10-04: qty 10

## 2023-10-04 MED ORDER — 0.9 % SODIUM CHLORIDE (POUR BTL) OPTIME
TOPICAL | Status: DC | PRN
  Administered 2023-10-04: 500 mL

## 2023-10-04 MED ORDER — ACETAMINOPHEN 10 MG/ML IV SOLN
INTRAVENOUS | Status: DC | PRN
  Administered 2023-10-04: 1000 mg via INTRAVENOUS

## 2023-10-04 MED ORDER — OXYCODONE HCL 5 MG PO TABS
10.0000 mg | ORAL_TABLET | ORAL | Status: DC | PRN
  Administered 2023-10-05 – 2023-10-06 (×12): 10 mg via ORAL
  Filled 2023-10-04 (×14): qty 2

## 2023-10-04 MED ORDER — LIDOCAINE HCL (PF) 2 % IJ SOLN
INTRAMUSCULAR | Status: AC
Start: 2023-10-04 — End: 2023-10-04
  Filled 2023-10-04: qty 5

## 2023-10-04 MED ORDER — CEFAZOLIN SODIUM-DEXTROSE 2-4 GM/100ML-% IV SOLN
INTRAVENOUS | Status: AC
  Filled 2023-10-04: qty 100

## 2023-10-04 MED ORDER — DOCUSATE SODIUM 100 MG PO CAPS
100.0000 mg | ORAL_CAPSULE | Freq: Two times a day (BID) | ORAL | Status: DC
  Administered 2023-10-04 – 2023-10-07 (×5): 100 mg via ORAL
  Filled 2023-10-04 (×10): qty 1

## 2023-10-04 MED ORDER — SURGIFLO WITH THROMBIN (HEMOSTATIC MATRIX KIT) OPTIME
TOPICAL | Status: DC | PRN
  Administered 2023-10-04 (×2): 1 via TOPICAL

## 2023-10-04 MED ORDER — CHLORHEXIDINE GLUCONATE 0.12 % MT SOLN
15.0000 mL | Freq: Once | OROMUCOSAL | Status: AC
  Administered 2023-10-04: 15 mL via OROMUCOSAL

## 2023-10-04 MED ORDER — HYDROMORPHONE HCL 1 MG/ML IJ SOLN
1.0000 mg | INTRAMUSCULAR | Status: DC | PRN
  Administered 2023-10-08: 1 mg via INTRAVENOUS
  Filled 2023-10-04: qty 1

## 2023-10-04 SURGICAL SUPPLY — 54 items
ALLOGRAFT BONE FIBER KORE 1CC (Bone Implant) IMPLANT
BASIN KIT SINGLE STR (MISCELLANEOUS) ×1 IMPLANT
BASKET BONE COLLECTION (BASKET) ×1 IMPLANT
BIT DRILL ACP 13 (DRILL) IMPLANT
BRUSH SCRUB EZ 4% CHG (MISCELLANEOUS) ×1 IMPLANT
BUR NEURO DRILL SOFT 3.0X3.8M (BURR) ×1 IMPLANT
CAP END 15X12 XCORE MINI 12D (Cap) IMPLANT
DERMABOND ADVANCED .7 DNX12 (GAUZE/BANDAGES/DRESSINGS) ×1 IMPLANT
DRAIN CHANNEL JP 10F RND 20C F (MISCELLANEOUS) IMPLANT
DRAIN CHANNEL JP 10FR RND 1/8 (MISCELLANEOUS) IMPLANT
DRAPE C ARM PK CFD 31 SPINE (DRAPES) ×1 IMPLANT
DRAPE LAPAROTOMY 77X122 PED (DRAPES) ×1 IMPLANT
DRAPE MICROSCOPE SPINE 48X150 (DRAPES) ×1 IMPLANT
DRSG TEGADERM 2-3/8X2-3/4 SM (GAUZE/BANDAGES/DRESSINGS) IMPLANT
ELECTRODE REM PT RTRN 9FT ADLT (ELECTROSURGICAL) ×1 IMPLANT
EVACUATOR SILICONE 100CC (DRAIN) IMPLANT
FEE INTRAOP CADWELL SUPPLY NCS (MISCELLANEOUS) IMPLANT
FEE INTRAOP MONITOR IMPULS NCS (MISCELLANEOUS) IMPLANT
GAUZE SPONGE 2X2 STRL 8-PLY (GAUZE/BANDAGES/DRESSINGS) IMPLANT
GLOVE BIOGEL PI IND STRL 7.0 (GLOVE) ×1 IMPLANT
GLOVE BIOGEL PI IND STRL 8 (GLOVE) ×1 IMPLANT
GLOVE SURG SYN 7.0 PF PI (GLOVE) ×1 IMPLANT
GLOVE SURG SYN 7.5 PF PI (GLOVE) ×1 IMPLANT
GOWN SRG XL LVL 3 NONREINFORCE (GOWNS) ×1 IMPLANT
GOWN STRL REUS W/ TWL XL LVL3 (GOWN DISPOSABLE) ×1 IMPLANT
HOLDER FOLEY CATH W/STRAP (MISCELLANEOUS) ×1 IMPLANT
KIT TURNOVER KIT A (KITS) ×1 IMPLANT
LAVAGE JET IRRISEPT WOUND (IRRIGATION / IRRIGATOR) ×1 IMPLANT
MANIFOLD NEPTUNE II (INSTRUMENTS) ×1 IMPLANT
NDL SAFETY ECLIPSE 18X1.5 (NEEDLE) ×1 IMPLANT
NS IRRIG 500ML POUR BTL (IV SOLUTION) ×1 IMPLANT
PACK LAMINECTOMY ARMC (PACKS) ×1 IMPLANT
PAD ARMBOARD POSITIONER FOAM (MISCELLANEOUS) ×1 IMPLANT
PIN ACP TEMP FIXATION (EXFIX) IMPLANT
PIN CASPAR 14 (PIN) ×1 IMPLANT
PIN DISTRATION 14MM (PIN) IMPLANT
PLATE ACP 1.6X38 2LVL (Plate) IMPLANT
SCREW ACP 3.5X17 (Screw) IMPLANT
SCREW ACP VA ST 3.5X15 (Screw) IMPLANT
SCREW SET END CAP (Screw) IMPLANT
SCREW XCORE MINI LOCK 12MM (Screw) IMPLANT
SPACER XCORE MINI 12 15-20 (Cage) IMPLANT
SPONGE KITTNER 5P (MISCELLANEOUS) ×1 IMPLANT
STAPLER SKIN PROX 35W (STAPLE) IMPLANT
SURGIFLO W/THROMBIN 8M KIT (HEMOSTASIS) ×1 IMPLANT
SUT BONE WAX W31G (SUTURE) IMPLANT
SUT ETHILON 3-0 (SUTURE) IMPLANT
SUT STRATA 3-0 30 PS-1 (SUTURE) IMPLANT
SUT VIC AB 3-0 SH 8-18 (SUTURE) ×1 IMPLANT
SYR 30ML LL (SYRINGE) ×1 IMPLANT
TAPE CLOTH 3X10 WHT NS LF (GAUZE/BANDAGES/DRESSINGS) ×3 IMPLANT
TOWEL OR 17X26 4PK STRL BLUE (TOWEL DISPOSABLE) ×2 IMPLANT
TRAP FLUID SMOKE EVACUATOR (MISCELLANEOUS) ×1 IMPLANT
TRAY FOLEY SLVR 16FR LF STAT (SET/KITS/TRAYS/PACK) IMPLANT
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
## 2023-10-04 NOTE — Brief Op Note (Signed)
 10/04/2023  6:47 PM  PATIENT:  Hector Armstrong  48 y.o. male  PRE-OPERATIVE DIAGNOSIS:  Cervical spinal cord injury, C3-4 and C4-5 Stenosis.  POST-OPERATIVE DIAGNOSIS:  Cervical spinal cord injury, C3-4 and C4-5 Stenosis.  PROCEDURE:  Procedure(s) with comments: C4 Corpectomy, C3-5 Artrhodesis and cage placement, Graft Harvest, Anterior C3-5 Plating   SURGEON:  Surgeons and Role:    * Claudene Penne ORN, MD - Primary   ASSISTANTS: Lyle Decamp, PA-C  ANESTHESIA:   general  EBL:  150 mL   BLOOD ADMINISTERED:none  DRAINS: (Right anterior cervical) Jackson-Pratt drain(s) with closed bulb suction in the neck   LOCAL MEDICATIONS USED:  MARCAINE      SPECIMEN:  No Specimen  DISPOSITION OF SPECIMEN:  N/A  COUNTS:  YES  TOURNIQUET:  * No tourniquets in log *  DICTATION: .Dragon Dictation  PLAN OF CARE: Admit to inpatient   PATIENT DISPOSITION:  PACU - hemodynamically stable.   Delay start of Pharmacological VTE agent (>24hrs) due to surgical blood loss or risk of bleeding: no

## 2023-10-04 NOTE — Discharge Instructions (Signed)
 Your surgeon has performed an operation on your cervical spine (neck) to relieve pressure on the spinal cord and/or nerves. This involved making an incision in the front of your neck and removing one or more of the discs that support your spine. Next, a small piece of bone, a titanium plate, and screws were used to fuse two or more of the vertebrae (bones) together.  The following are instructions to help in your recovery once you have been discharged from the hospital. Even if you feel well, it is important that you follow these activity guidelines. If you do not let your neck heal properly from the surgery, you can increase the chance of return of your symptoms and other complications.  * Do not take anti-inflammatory medications for 3 months after surgery (naproxen [Aleve], ibuprofen  [Advil , Motrin ], celecoxib [Celebrex], etc.). These medications can prevent your bones from healing properly.  Activity    No bending, lifting, or twisting ("BLT"). Avoid lifting objects heavier than 10 pounds (gallon milk jug).  Where possible, avoid household activities that involve lifting, bending, reaching, pushing, or pulling such as laundry, vacuuming, grocery shopping, and childcare. Try to arrange for help from friends and family for these activities while your back heals.  Increase physical activity slowly as tolerated.  Taking short walks is encouraged, but avoid strenuous exercise. Do not jog, run, bicycle, lift weights, or participate in any other exercises unless specifically allowed by your doctor.  Talk to your doctor before resuming sexual activity.  You should not drive until cleared by your doctor.  Until released by your doctor, you should not return to work or school.  You should rest at home and let your body heal.   You may shower three days after your surgery.  After showering, lightly dab your incision dry. Do not take a tub bath or go swimming until approved by your doctor at your  follow-up appointment.  If your doctor ordered a cervical collar (neck brace) for you, you should wear it whenever you are out of bed. You may remove it when lying down or sleeping, but you should wear it at all other times. Not all neck surgeries require a cervical collar.  If you smoke, we strongly recommend that you quit.  Smoking has been proven to interfere with normal bone healing and will dramatically reduce the success rate of your surgery. Please contact [facility] (800-QUIT-NOW) and use the resources at www.[facility].com for assistance in stopping smoking.  Surgical Incision   If you have a dressing on your incision, you may remove it two days after your surgery. Keep your incision area clean and dry.  If you have staples or stitches on your incision, you should have a follow up scheduled for removal. If you do not have staples or stitches, you will have steri-strips (small pieces of surgical tape) or Dermabond glue. The steri-strips/glue should begin to peel away within about a week (it is fine if the steri-strips fall off before then). If the strips are still in place one week after your surgery, you may gently remove them.  Diet           You may return to your usual diet. However, you may experience discomfort when swallowing in the first month after your surgery. This is normal. You may find that softer foods are more comfortable for you to swallow. Be sure to stay hydrated.  You have been prescribed narcotic pain medications.  This often will cause constipation along with the anesthesia  that you underwent.  Please obtain Colace and senna. This has been sent to your local pharmacy, but at times it is not covered by insurance and you may obtain it over the counter..  You should take a stool softener and laxative for the duration of you taking the narcotic pain medications.  When to Contact Us   You may experience pain in your neck and/or pain between your shoulder blades. This is  normal and should improve in the next few weeks with the help of pain medication, muscle relaxers, and rest. Some patients report that a warm compress on the back of the neck or between the shoulder blades helps.  However, should you experience any of the following, contact us  immediately: New numbness or weakness Pain that is progressively getting worse, and is not relieved by your pain medication, muscle relaxers, rest, and warm compresses Bleeding, redness, swelling, pain, or drainage from surgical incision Chills or flu-like symptoms Fever greater than 101.0 F (38.3 C) Inability to eat, drink fluids, or take medications Problems with bowel or bladder functions Difficulty breathing or shortness of breath Warmth, tenderness, or swelling in your calf Contact Information How to contact us :  If you have any questions/concerns before or after surgery, you can reach us  at [phone], or you can send a mychart message. We can be reached by phone or mychart 8am-4pm, Monday-Friday.  *Please note: Calls after 4pm are forwarded to a third party answering service. Mychart messages are not routinely monitored during evenings, weekends, and holidays. Please call our office to contact the answering service for urgent concerns during non-business hours.

## 2023-10-04 NOTE — Transfer of Care (Signed)
 Immediate Anesthesia Transfer of Care Note  Patient: Hector Armstrong  Procedure(s) Performed: C4 Corpectomy, C3-5 Artrhodesis and cage placement, Graft Harvest, Anterior C3-5 Plating  Patient Location: PACU  Anesthesia Type:General  Level of Consciousness: drowsy  Airway & Oxygen Therapy: Patient Spontanous Breathing and Patient connected to face mask oxygen  Post-op Assessment: Report given to RN and Post -op Vital signs reviewed and stable  Post vital signs: Reviewed and stable  Last Vitals:  Vitals Value Taken Time  BP 134/87 10/04/23 18:38  Temp    Pulse 75 10/04/23 18:44  Resp 21 10/04/23 18:44  SpO2 100 % 10/04/23 18:44  Vitals shown include unfiled device data.  Last Pain:  Vitals:   10/04/23 1324  TempSrc: Temporal         Complications: No notable events documented.
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
## 2023-10-04 NOTE — Anesthesia Preprocedure Evaluation (Signed)
 Anesthesia Evaluation  Patient identified by MRN, date of birth, ID band Patient awake    Reviewed: Allergy & Precautions, NPO status , Patient's Chart, lab work & pertinent test results  Airway Mallampati: III  TM Distance: >3 FB Neck ROM: full    Dental  (+) Chipped   Pulmonary neg pulmonary ROS   Pulmonary exam normal        Cardiovascular negative cardio ROS Normal cardiovascular exam     Neuro/Psych  PSYCHIATRIC DISORDERS  Depression    negative neurological ROS     GI/Hepatic negative GI ROS, Neg liver ROS,,,  Endo/Other  negative endocrine ROS    Renal/GU      Musculoskeletal   Abdominal   Peds  Hematology  (+) HIV  Anesthesia Other Findings Past Medical History: No date: Depression No date: History of syphilis No date: HIV (human immunodeficiency virus infection) (HCC) No date: IBS (irritable bowel syndrome) No date: Peripheral neuropathy No date: 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) No date: Spinal stenosis in cervical region  Past Surgical History: No date: COLONOSCOPY No date: HERNIA REPAIR  BMI    Body Mass Index: 19.25 kg/m      Reproductive/Obstetrics negative OB ROS                              Anesthesia Physical Anesthesia Plan  ASA: 2  Anesthesia Plan: General ETT   Post-op Pain Management:    Induction: Intravenous  PONV Risk Score and Plan: 2 and Ondansetron , Dexamethasone , Midazolam , Propofol  infusion and TIVA  Airway Management Planned: Oral ETT  Additional Equipment:   Intra-op Plan:   Post-operative Plan: Extubation in OR  Informed Consent: I have reviewed the patients History and Physical, chart, labs and discussed the procedure including the risks, benefits and alternatives for the proposed anesthesia with the patient or authorized representative who has indicated his/her understanding and acceptance.      Dental Advisory Given  Plan Discussed with: Anesthesiologist, CRNA and Surgeon  Anesthesia Plan Comments: (Patient consented for risks of anesthesia including but not limited to:  - adverse reactions to medications - damage to eyes, teeth, lips or other oral mucosa - nerve damage due to positioning  - sore throat or hoarseness - Damage to heart, brain, nerves, lungs, other parts of body or loss of life  Patient voiced understanding and assent.)        Anesthesia Quick Evaluation

## 2023-10-04 NOTE — Interval H&P Note (Signed)
 History and Physical Interval Note:  10/04/2023 2:30 PM  Hector Armstrong  has presented today for surgery, with the diagnosis of Cervical spinal cord injury, C3-4 and C4-5 Stenosis..  The various methods of treatment have been discussed with the patient and family. After consideration of risks, benefits and other options for treatment, the patient has consented to  Procedure(s) with comments: C4 Corpectomy, C3-5 Artrhodesis and cage placement, Graft Harvest, Anterior C3-5 Plating (N/A) - C4 Corpectomy, C3-5 Arthrodedis and cage placement, Graft Harvest, Anterior C3-5 Plating as a surgical intervention.  The patient's history has been reviewed, patient examined, no change in status, stable for surgery.  I have reviewed the patient's chart and labs.  Questions were answered to the patient's satisfaction.    Heart and lungs are clear.  Penne LELON Sharps

## 2023-10-04 NOTE — Anesthesia Procedure Notes (Addendum)
 Procedure Name: Intubation Date/Time: 10/04/2023 2:58 PM  Performed by: Laverna Duwaine BRAVO, RNPre-anesthesia Checklist: Patient identified, Emergency Drugs available, Suction available and Patient being monitored Patient Re-evaluated:Patient Re-evaluated prior to induction Oxygen Delivery Method: Circle System Utilized Preoxygenation: Pre-oxygenation with 100% oxygen Induction Type: IV induction Ventilation: Mask ventilation without difficulty Laryngoscope Size: McGrath and 4 Grade View: Grade I Tube type: Oral Tube size: 7.5 mm Number of attempts: 1 Airway Equipment and Method: Stylet and Oral airway Placement Confirmation: ETT inserted through vocal cords under direct vision, positive ETCO2 and breath sounds checked- equal and bilateral Secured at: 22 cm Tube secured with: Tape Dental Injury: Teeth and Oropharynx as per pre-operative assessment  Comments: Atraumatic intubation. Teeth unchanged from prior

## 2023-10-05 ENCOUNTER — Encounter: Payer: Self-pay | Admitting: Neurosurgery

## 2023-10-05 ENCOUNTER — Other Ambulatory Visit: Payer: Self-pay

## 2023-10-05 LAB — RENAL FUNCTION PANEL
Albumin: 3.9 g/dL (ref 3.5–5.0)
Anion gap: 10 (ref 5–15)
BUN: 16 mg/dL (ref 6–20)
CO2: 24 mmol/L (ref 22–32)
Calcium: 9 mg/dL (ref 8.9–10.3)
Chloride: 101 mmol/L (ref 98–111)
Creatinine, Ser: 0.95 mg/dL (ref 0.61–1.24)
GFR, Estimated: >60 mL/min (ref >60)
Glucose, Bld: 116 mg/dL — ABNORMAL HIGH (ref 70–99)
Phosphorus: 4.1 mg/dL (ref 2.5–4.6)
Potassium: 4.2 mmol/L (ref 3.5–5.1)
Sodium: 135 mmol/L (ref 135–145)

## 2023-10-05 LAB — HIV-1 RNA QUANT-NO REFLEX-BLD
HIV 1 RNA Quant: NOT DETECTED {copies}/mL
HIV-1 RNA Quant, Log: NOT DETECTED {Log_copies}/mL

## 2023-10-05 LAB — HEPATITIS A ANTIBODY, TOTAL: Hepatitis A AB,Total: REACTIVE — AB
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
## 2023-10-05 NOTE — Plan of Care (Signed)
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
## 2023-10-05 NOTE — Anesthesia Postprocedure Evaluation (Signed)
 Anesthesia Post Note  Patient: Hector Armstrong  Procedure(s) Performed: C4 Corpectomy, C3-5 Artrhodesis and cage placement, Graft Harvest, Anterior C3-5 Plating  Patient location during evaluation: PACU Anesthesia Type: General Level of consciousness: awake and alert Pain management: pain level controlled Vital Signs Assessment: post-procedure vital signs reviewed and stable Respiratory status: spontaneous breathing, nonlabored ventilation, respiratory function stable and patient connected to nasal cannula oxygen Cardiovascular status: blood pressure returned to baseline and stable Postop Assessment: no apparent nausea or vomiting Anesthetic complications: no   No notable events documented.   Last Vitals:  Vitals:   10/04/23 2012 10/05/23 0004  BP: (!) 167/99 (!) 159/96  Pulse: 66 72  Resp: 18 16  Temp: 36.8 C 36.7 C  SpO2: 100% 100%    Last Pain:  Vitals:   10/05/23 0004  TempSrc: Oral  PainSc:                  Lendia LITTIE Mae

## 2023-10-05 NOTE — TOC Initial Note (Signed)
 Transition of Care [facility]) - Initial/Assessment Note    Patient Details  Name: Hector Armstrong MRN: [id number] Date of Birth: [birth year]  Transition of Care [facility]) CM/SW Contact:    Alvaro Louder, LCSW Phone Number: 10/05/2023, 2:40 PM  Clinical Narrative:    Per Chart review patient from home, PCP is [facility]. [place] is following patient Patient is agreeable. TOC to follow up with CIR if any needs arise.   TOC to follow for discharge                 Patient Goals and CMS Choice            Expected Discharge Plan and Services                                              Prior Living Arrangements/Services                       Activities of Daily Living   ADL Screening (condition at time of admission) Independently performs ADLs?: Yes (appropriate for developmental age) Is the patient deaf or have difficulty hearing?: No Does the patient have difficulty seeing, even when wearing glasses/contacts?: No Does the patient have difficulty concentrating, remembering, or making decisions?: No  Permission Sought/Granted                  Emotional Assessment              Admission diagnosis:  Spinal cord injury at C5-C7 level without injury of spinal bone, initial encounter (HCC) [S14.105A] Spinal stenosis in cervical region [M48.02] Intervertebral cervical disc disorder with myelopathy, 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] Cervical myelopathy (HCC) [G95.9] Patient Active Problem List   Diagnosis Date Noted   Cervical myelopathy (HCC) 10/04/2023   Spinal cord injury at C5-C7 level without injury of spinal bone (HCC) 08/31/2023   Spinal stenosis in cervical region 08/31/2023   Intervertebral cervical disc disorder with myelopathy, cervical region 08/31/2023   HIV disease (HCC) 08/10/2023   Health care maintenance 08/10/2023   Medication management 08/10/2023   Vaccine counseling 08/10/2023   Medication monitoring encounter 07/17/2016    Hereditary and idiopathic peripheral neuropathy 12/21/2015   Screening examination for venereal disease 11/19/2014   Encounter for long-term (current) use of medications 11/19/2014   Irritable bowel syndrome 10/10/2012   History of syphilis 06/19/2006   Human immunodeficiency virus (HIV) disease (HCC) 06/14/2006   PCP:  Sal Labella, MD Pharmacy:   [facility] [place], [place] - [address] [facility] [address] [place] [address] Phone: [phone] Fax: [phone]  [place] - [facility] [address] [place] [address] Phone: [phone] Fax: [phone]  [facility] - [facility] [address] [address] Phone: [phone] Fax: [phone]  [facility] [address] [place] - [address] [place] [address] Phone: [phone] Fax: [phone]  [facility] [address], [place] - [address] [place] [address] Phone: [phone] Fax: [phone]     Social Drivers of Health (SDOH) Social History: SDOH Screenings   Food Insecurity: No Food Insecurity (10/05/2023)  Housing: Low Risk  (10/05/2023)  Transportation Needs: No Transportation Needs (10/05/2023)  Utilities: Not At Risk (10/05/2023)  Depression (PHQ2-9): Medium Risk (08/10/2023)  Social Connections: Moderately Isolated (10/05/2023)  Tobacco Use: Low Risk  (10/04/2023)   SDOH Interventions:     Readmission Risk Interventions     No data to display
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
## 2023-10-05 NOTE — Progress Notes (Signed)
  Inpatient Rehab Admissions Coordinator :  Per therapy recommendations, patient was screened for CIR candidacy by Ottie Glazier RN MSN.  At this time patient appears to be a potential candidate for CIR. I will place a rehab consult per protocol for full assessment. Please call me with any questions.  Ottie Glazier RN MSN Admissions Coordinator [phone]

## 2023-10-06 MED ORDER — DEXAMETHASONE SODIUM PHOSPHATE 4 MG/ML IJ SOLN
3.0000 mg | Freq: Four times a day (QID) | INTRAMUSCULAR | Status: AC
  Administered 2023-10-07 – 2023-10-08 (×4): 3 mg via INTRAVENOUS
  Filled 2023-10-06 (×4): qty 0.75

## 2023-10-06 MED ORDER — DEXAMETHASONE SODIUM PHOSPHATE 4 MG/ML IJ SOLN
2.0000 mg | Freq: Four times a day (QID) | INTRAMUSCULAR | Status: AC
  Administered 2023-10-08 – 2023-10-09 (×4): 2 mg via INTRAVENOUS
  Filled 2023-10-06 (×4): qty 0.5

## 2023-10-06 MED ORDER — FAMOTIDINE 20 MG PO TABS
10.0000 mg | ORAL_TABLET | Freq: Every day | ORAL | Status: DC
  Administered 2023-10-06 – 2023-10-08 (×3): 10 mg via ORAL
  Filled 2023-10-06 (×3): qty 1

## 2023-10-06 MED ORDER — DEXAMETHASONE SODIUM PHOSPHATE 4 MG/ML IJ SOLN
1.0000 mg | Freq: Four times a day (QID) | INTRAMUSCULAR | Status: DC
  Filled 2023-10-06: qty 0.25

## 2023-10-06 MED ORDER — DEXAMETHASONE SODIUM PHOSPHATE 4 MG/ML IJ SOLN
4.0000 mg | Freq: Four times a day (QID) | INTRAMUSCULAR | Status: AC
  Administered 2023-10-06 – 2023-10-07 (×4): 4 mg via INTRAVENOUS
  Filled 2023-10-06 (×4): qty 1

## 2023-10-06 NOTE — Plan of Care (Signed)
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
## 2023-10-06 NOTE — Plan of Care (Signed)
   Problem: Education: Goal: Knowledge of General Education information will improve Description: Including pain rating scale, medication(s)/side effects and non-pharmacologic comfort measures Outcome: Progressing   Problem: Activity: Goal: Risk for activity intolerance will decrease Outcome: Progressing   Problem: Coping: Goal: Level of anxiety will decrease Outcome: Progressing
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
## 2023-10-06 NOTE — Progress Notes (Addendum)
 Inpatient Rehab Admissions Coordinator:   Consult received. Attempted to contact pt. No one has answered the phone. Will continue to follow.  1245: NSG informed AC that pt is unable to talk on the phone at this time d/t throat being swollen since surgery. She said pt is hard to understand. Will f/u at later date.   Tinnie Yvone Cohens, MS, CCC-SLP Admissions Coordinator [phone]
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
## 2023-10-07 MED ORDER — MELATONIN 5 MG PO TABS
5.0000 mg | ORAL_TABLET | Freq: Every day | ORAL | Status: DC
  Administered 2023-10-07 – 2023-10-08 (×2): 5 mg via ORAL
  Filled 2023-10-07 (×2): qty 1
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
## 2023-10-07 NOTE — Progress Notes (Signed)
 Patient ID: Hector Armstrong, male   DOB: Sep 24, 1975, 48 y.o.   MRN: [id number]  Attending Progress Note  History: Hector Armstrong is here for C4 Corpectomy, C3-5 Artrhodesis and cage placement, Graft Harvest, Anterior C3-5 Plating, they are currently 3 Days Post-Op.   POD3: Drain removed. Steroids started yesterday.  Eating is better today POD2: Eating, but having some swallowing discomfort as expected.  POD1: Expected periscapular pain and sore throat.  No progressive decline in his examination after decompression.  Physical Exam: Vitals:   10/07/23 0455 10/07/23 0719  BP: (!) 118/91 128/86  Pulse: 74 71  Resp: 19 16  Temp: 98.3 F (36.8 C) 98.2 F (36.8 C)  SpO2: 99% 100%   Output by Drain (mL) 10/05/23 0701 - 10/05/23 1900 10/05/23 1901 - 10/06/23 0700 10/06/23 0701 - 10/06/23 1900 10/06/23 1901 - 10/07/23 0700 10/07/23 0701 - 10/07/23 0942  Closed System Drain 1 Left;Anterior Neck Bulb (JP) 10 Fr.   5     I/O last 3 completed shifts: In: 246 [P.O.:240; I.V.:6] Out: 1405 [Urine:1400; Drains:5] No intake/output data recorded.  AA Ox3 CNI  Strength: Patient continues to have severe deficit in his bilateral hands.  He is not able to make full closure of his left hand and continues to have limited finger spread.  He does have slight flexion posture and has bilateral wrists consistent with his progressive spasticity.  Lower extremities also continue have spasticity as expected.  Data:  Recent Labs  Lab 10/05/23 0511  NA 135  K 4.2  CL 101  CO2 24  BUN 16  CREATININE 0.95  GLUCOSE 116*  CALCIUM  9.0   No results for input(s): AST, ALT, ALKPHOS in the last 168 hours.  Invalid input(s): TBILI   No results for input(s): WBC, HGB, HCT, PLT in the last 168 hours. No results for input(s): APTT, INR in the last 168 hours.      No results found.   Other tests/results: None  Scheduled Meds:  atorvastatin   40 mg Oral Daily   dexamethasone   (DECADRON ) injection  3 mg Intravenous QID   Followed by   NOREEN ON 10/08/2023] dexamethasone  (DECADRON ) injection  2 mg Intravenous QID   Followed by   NOREEN ON 10/09/2023] dexamethasone  (DECADRON ) injection  1 mg Intravenous QID   docusate sodium   100 mg Oral BID   enoxaparin  (LOVENOX ) injection  40 mg Subcutaneous Q24H   famotidine   10 mg Oral QHS   senna  1 tablet Oral BID   sodium chloride  flush  3 mL Intravenous Q12H   Continuous Infusions:   PRN Meds:.acetaminophen  **OR** acetaminophen , bisacodyl , HYDROmorphone  (DILAUDID ) injection, magnesium  citrate, menthol  **OR** phenol, methocarbamol  **OR** methocarbamol  (ROBAXIN ) injection, ondansetron  **OR** ondansetron  (ZOFRAN ) IV, oxyCODONE , oxyCODONE , polyethylene glycol, sodium chloride  flush  Assessment/Plan:  Hector Armstrong currently 3 Days Post-Op from a C4 anterior cervical corpectomy and fusion.  Overall they are doing well postoperatively.  Having expected postoperative periscapular pain and neck soreness which has improved since POD1..  We will advance his diet.  He is working with physical therapy and Occupational Therapy.    - Drains: Removed, patient has a nylon stitch that will need to be removed prior to discharge. - Steroid Taper for swallowing pain - mobilize - pain control - DVT prophylaxis - PTOT, agree with consideration for inpatient rehab, patient previously high functioning and unfortunately suffered from a central cord type injury.  Has had progressive spasticity prior to decompression.  Initially presented with lower extremity spasticity but no has  clear worsening spasticity in his left wrist and beginning spasticity in his right wrist.  Hector MICAEL Sharps, MD/MSCR Department of Neurosurgery
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
## 2023-10-07 NOTE — Plan of Care (Signed)

## 2023-10-07 NOTE — Progress Notes (Addendum)
 Inpatient Rehab Admissions:  Inpatient Rehab Consult received.  I spoke with patient on the telephone for rehabilitation assessment and to discuss goals and expectations of an inpatient rehab admission.  Discussed average length of stay, insurance authorization requirement and discharge home after completion of CIR. Pt acknowledged understanding and is interested in pursuing CIR. Pt confirmed that he will have intermittent support after discharge. Will discuss dispo with admissions team. Will continue to follow.  Spoke with pt's roommate Rufus. He confirmed that he will be able to provide intermittent support for pt after discharge.  Signed: Tinnie Yvone Cohens, MS, CCC-SLP Admissions Coordinator [phone]

## 2023-10-08 ENCOUNTER — Other Ambulatory Visit: Payer: Self-pay

## 2023-10-08 ENCOUNTER — Telehealth: Payer: Self-pay | Admitting: Neurosurgery

## 2023-10-08 MED ORDER — SENNA 8.6 MG PO TABS
1.0000 | ORAL_TABLET | Freq: Two times a day (BID) | ORAL | 0 refills | Status: DC | PRN
  Filled 2023-10-08: qty 30, 15d supply, fill #0

## 2023-10-08 MED ORDER — OXYCODONE HCL 5 MG PO TABS
5.0000 mg | ORAL_TABLET | ORAL | 0 refills | Status: DC | PRN
  Filled 2023-10-08: qty 30, 5d supply, fill #0

## 2023-10-08 MED ORDER — DEXAMETHASONE 1 MG PO TABS
1.0000 mg | ORAL_TABLET | Freq: Four times a day (QID) | ORAL | 0 refills | Status: DC
  Filled 2023-10-08: qty 4, 1d supply, fill #0

## 2023-10-08 MED ORDER — METHOCARBAMOL 500 MG PO TABS
500.0000 mg | ORAL_TABLET | Freq: Four times a day (QID) | ORAL | 0 refills | Status: DC | PRN
  Filled 2023-10-08: qty 120, 30d supply, fill #0

## 2023-10-08 MED ORDER — ACETAMINOPHEN 325 MG PO TABS: 650.0000 mg | ORAL_TABLET | ORAL | Status: DC | PRN

## 2023-10-08 NOTE — Progress Notes (Signed)
 Inpatient Rehab Admissions Coordinator:  Note pt was supervision-CGA with transfers and supervision with gait today with therapy. Pt no longer appear to warrant the intensity of CIR. Pt, TOC and Dr. Claudene updated. AC will sign off.   Tinnie Yvone Cohens, MS, CCC-SLP Admissions Coordinator [phone]

## 2023-10-08 NOTE — Discharge Summary (Signed)
 Discharge Summary  Patient ID: Hector Armstrong MRN: [id number] DOB/AGE: [birth year] 48 y.o.  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  His postoperative course was complicated by dysphagia which improved with steroids.  He was seen evaluated by physical therapy and initially deemed appropriate for inpatient rehab however he progressed throughout his hospitalization and was ultimately recommended for discharge home on POD5.  His drain was monitored closely and decreased to an acceptable level.  It was removed on postop day 3.  His pain removed remained well-controlled with oral medication and he was discharged home with prescriptions for oxycodone , Robaxin , and senna to take as needed as well as a steroid taper.   Consults: None  Significant Diagnostic Studies: NA  Treatments: surgery: as above. Please see separately dictated operative report for further details   Discharge Exam: Blood pressure (!) 147/80, pulse 65, temperature 97.9 F (36.6 C), temperature source Oral, resp. rate 16, height 5' 11 (1.803 m), weight 66.3 kg, SpO2 100%. AA Ox3 CNI   Strength: Side Biceps Triceps Deltoid Interossei Grip Wrist Ext. Wrist Flex.  R 5 5 5 4  4- 4- 4-  L 5 5 5 2 2 3 2    Incision c/d/I with dermabond in place  Disposition: Discharge disposition: 01-Home or Self Care       Discharge Instructions     Incentive spirometry RT   Complete by: As directed    No dressing needed   Complete by: As  directed       Allergies as of 10/09/2023   No Known Allergies      Medication List     STOP taking these medications    cyclobenzaprine  5 MG tablet Commonly known as: FLEXERIL        TAKE these medications    acetaminophen  325 MG tablet Commonly known as: TYLENOL  Take 2 tablets (650 mg total) by mouth every 4 (four) hours as needed for mild pain (pain score 1-3) or fever (or temp > 100.5).   atorvastatin  40 MG tablet Commonly known as: Lipitor Take 1 tablet (40 mg total) by mouth daily.   Cabenuva  600 & 900 MG/3ML injection Generic drug: cabotegravir  & rilpivirine  ER Inject 1 kit into the muscle every 30 (thirty) days. What changed: when to take this   dexamethasone  1 MG tablet Commonly known as: DECADRON  Take 1 tablet (1 mg total) by mouth 4 (four) times daily.   Genvoya  150-150-200-10 MG Tabs tablet Generic drug: elvitegravir-cobicistat-emtricitabine-tenofovir  Take 1 tablet by mouth daily with breakfast.   methocarbamol  500 MG tablet Commonly known as: ROBAXIN  Take 1 tablet (500 mg total) by mouth every 6 (six) hours as needed for muscle spasms.   MULTIVITAMIN ADULTS 50+ PO Take by mouth daily.   oxyCODONE  5 MG immediate release tablet Commonly known as: Oxy IR/ROXICODONE  Take 1 tablet (5 mg total) by mouth every 4 (four) hours as needed for severe pain (pain score 7-10).   senna 8.6 MG Tabs tablet Commonly known as: SENOKOT Take 1 tablet (8.6 mg total) by mouth 2 (two) times daily as needed  for mild constipation.               Discharge Care Instructions  (From admission, onward)           Start     Ordered   10/08/23 0000  No dressing needed        10/08/23 1457             Signed: Edsel Jama Goods 10/09/2023, 8:20 AM

## 2023-10-08 NOTE — Progress Notes (Signed)
 Entered in error please remove

## 2023-10-08 NOTE — Plan of Care (Signed)
  Problem: Education: Goal: Knowledge of General Education information will improve Description: Including pain rating scale, medication(s)/side effects and non-pharmacologic comfort measures Outcome: Progressing   Problem: Health Behavior/Discharge Planning: Goal: Ability to manage health-related needs will improve Outcome: Progressing   Problem: Elimination: Goal: Will not experience complications related to urinary retention Outcome: Progressing   Problem: Pain Managment: Goal: General experience of comfort will improve and/or be controlled Outcome: Progressing

## 2023-10-08 NOTE — Progress Notes (Addendum)
 Patient ID: Hector Armstrong, male   DOB: [birth year], 48 y.o.   MRN: [id number]  Attending Progress Note  History: Hector Armstrong is here for C4 Corpectomy, C3-5 Artrhodesis and cage placement, [employer], Anterior C3-5 Plating, they are currently 4 Days Post-Op.   POD4: Pt doing fair this morning. Still refusing suture removal from drain site 2/2 to pain  POD3: Drain removed. Steroids started yesterday.  Eating is better today POD2: Eating, but having some swallowing discomfort as expected.  POD1: Expected periscapular pain and sore throat.  No progressive decline in his examination after decompression.  Physical Exam: Vitals:   10/07/23 1947 10/08/23 0500  BP: 129/72 117/79  Pulse: 71 62  Resp: 16 17  Temp: 98.1 F (36.7 C) 98 F (36.7 C)  SpO2: 100% 100%   Output by Drain (mL) 10/06/23 0701 - 10/06/23 1900 10/06/23 1901 - 10/07/23 0700 10/07/23 0701 - 10/07/23 1900 10/07/23 1901 - 10/08/23 0700 10/08/23 0701 - 10/08/23 0740  Patient has no LDAs of requested type attached.   I/O last 3 completed shifts: In: 483 [P.O.:480; I.V.:3] Out: -  No intake/output data recorded.  AA Ox3 CNI  Strength: Side Biceps Triceps Deltoid Interossei Grip Wrist Ext. Wrist Flex.  R 5 5 5 4  4- 4- 4-  L 5 5 5 2 2 3 2     Incision c/d/I wit one stitch in place at drain site.  Data:  Recent Labs  Lab 10/05/23 0511  NA 135  K 4.2  CL 101  CO2 24  BUN 16  CREATININE 0.95  GLUCOSE 116*  CALCIUM  9.0   No results for input(s): AST, ALT, ALKPHOS in the last 168 hours.  Invalid input(s): TBILI   No results for input(s): WBC, HGB, HCT, PLT in the last 168 hours. No results for input(s): APTT, INR in the last 168 hours.      No results found.   Other tests/results: None  Scheduled Meds:  atorvastatin   40 mg Oral Daily   dexamethasone  (DECADRON ) injection  3 mg Intravenous QID   Followed by   dexamethasone  (DECADRON ) injection  2 mg Intravenous QID    Followed by   NOREEN ON 10/09/2023] dexamethasone  (DECADRON ) injection  1 mg Intravenous QID   docusate sodium   100 mg Oral BID   enoxaparin  (LOVENOX ) injection  40 mg Subcutaneous Q24H   famotidine   10 mg Oral QHS   melatonin  5 mg Oral QHS   senna  1 tablet Oral BID   sodium chloride  flush  3 mL Intravenous Q12H   Continuous Infusions:   PRN Meds:.acetaminophen  **OR** acetaminophen , bisacodyl , HYDROmorphone  (DILAUDID ) injection, magnesium  citrate, menthol  **OR** phenol, methocarbamol  **OR** methocarbamol  (ROBAXIN ) injection, ondansetron  **OR** ondansetron  (ZOFRAN ) IV, oxyCODONE , oxyCODONE , polyethylene glycol, sodium chloride  flush  Assessment/Plan:  Hector Armstrong currently 4 Days Post-Op from a C4 anterior cervical corpectomy and fusion.  Overall they are doing well postoperatively.  Having expected postoperative periscapular pain and neck soreness which has improved since POD1    - Drains: Removed, patient has a stitch that will need to be removed prior to discharge. - Steroid Taper for swallowing pain - mobilize - pain control - DVT prophylaxis - PTOT, considering IPR - medical stable for d/c when approved.   Edsel Goods PA-C Department of Neurosurgery

## 2023-10-08 NOTE — Telephone Encounter (Signed)
 error

## 2023-10-08 NOTE — TOC CM/SW Note (Signed)
.  Occupational Therapy * Physical Therapy * Speech Therapy  DATE 10/08/2023 PATIENT NAME Hector Armstrong PATIENT MRN [id number]   DIAGNOSIS/DIAGNOSIS CODE: Cervical myelopathy   DATE OF DISCHARGE 10/08/2023  PRIMARY CARE PHYSICIAN: Vijaya Mamedi PCP PHONE/FAX [phone]   Dear Provider (Name: Elgie Cloud Fax: ):   I certify that I have examined this patient and that occupational/physical/speech therapy is necessary on an outpatient basis.    The patient has expressed interest in completing their recommended course of therapy at your location.  Once a formal order from the patient's primary care physician has been obtained, please contact him/her to schedule an appointment for evaluation at your earliest convenience.  [ x ]  Physical Therapy Evaluate and Treat  [  x]  Occupational Therapy Evaluate and Treat  [  ]  Speech Therapy Evaluate and Treat  The patient's primary care physician (listed above) must furnish and be responsible for a formal order such that the recommended services may be furnished while under the primary physician's care, and that the plan of care will be established and reviewed every 30 days (or more often if condition necessitates).
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
## 2023-10-09 NOTE — Progress Notes (Signed)
 Patient ID: Hector Armstrong, male   DOB: [birth year], 48 y.o.   MRN: [id number]  Attending Progress Note  History: Hector Armstrong is here for C4 Corpectomy, C3-5 Artrhodesis and cage placement, Graft Harvest, Anterior C3-5 Plating, they are currently 5 Days Post-Op.   POD5: Pt did not d/c yesterday due to lack of a rid and arrange TOC needs. Doing better this morning.  POD4: Pt doing fair this morning. Still refusing suture removal from drain site 2/2 to pain  POD3: Drain removed. Steroids started yesterday.  Eating is better today POD2: Eating, but having some swallowing discomfort as expected.  POD1: Expected periscapular pain and sore throat.  No progressive decline in his examination after decompression.  Physical Exam: Vitals:   10/09/23 0436 10/09/23 0731  BP: (!) 147/92 (!) 147/80  Pulse: (!) 56 65  Resp: 17 16  Temp: (!) 97.4 F (36.3 C) 97.9 F (36.6 C)  SpO2: 100% 100%   Output by Drain (mL) 10/07/23 0701 - 10/07/23 1900 10/07/23 1901 - 10/08/23 0700 10/08/23 0701 - 10/08/23 1900 10/08/23 1901 - 10/09/23 0700 10/09/23 0701 - 10/09/23 0818  Patient has no LDAs of requested type attached.   I/O last 3 completed shifts: In: 840 [P.O.:840] Out: -  No intake/output data recorded.  AA Ox3 CNI  Strength: Side Biceps Triceps Deltoid Interossei Grip Wrist Ext. Wrist Flex.  R 5 5 5 4  4- 4- 4-  L 5 5 5 2 2 3 2     Incision c/d/I wit one stitch in place at drain site.  Data:  Recent Labs  Lab 10/05/23 0511  NA 135  K 4.2  CL 101  CO2 24  BUN 16  CREATININE 0.95  GLUCOSE 116*  CALCIUM  9.0   No results for input(s): AST, ALT, ALKPHOS in the last 168 hours.  Invalid input(s): TBILI   No results for input(s): WBC, HGB, HCT, PLT in the last 168 hours. No results for input(s): APTT, INR in the last 168 hours.      No results found.   Other tests/results: None  Scheduled Meds:  atorvastatin   40 mg Oral Daily   dexamethasone  (DECADRON )  injection  2 mg Intravenous QID   Followed by   dexamethasone  (DECADRON ) injection  1 mg Intravenous QID   docusate sodium   100 mg Oral BID   enoxaparin  (LOVENOX ) injection  40 mg Subcutaneous Q24H   famotidine   10 mg Oral QHS   melatonin  5 mg Oral QHS   senna  1 tablet Oral BID   sodium chloride  flush  3 mL Intravenous Q12H   Continuous Infusions:   PRN Meds:.acetaminophen  **OR** acetaminophen , bisacodyl , HYDROmorphone  (DILAUDID ) injection, magnesium  citrate, menthol  **OR** phenol, methocarbamol  **OR** methocarbamol  (ROBAXIN ) injection, ondansetron  **OR** ondansetron  (ZOFRAN ) IV, oxyCODONE , oxyCODONE , polyethylene glycol, sodium chloride  flush  Assessment/Plan:  Hector Armstrong currently 5 Days Post-Op from a C4 anterior cervical corpectomy and fusion.  Overall they are doing well postoperatively.  Having expected postoperative periscapular pain and neck soreness which has improved since POD1    - Steroid Taper for swallowing pain - mobilize - pain control - DVT prophylaxis - PTOT, improved and no longer IPR candidate. Will d/c home today  Hector Goods PA-C Department of Neurosurgery

## 2023-10-09 NOTE — TOC Transition Note (Signed)
 Transition of Care [facility]) - Discharge Note   Patient Details  Name: Hector Armstrong MRN: [id number] Date of Birth: 1975/04/20  Transition of [facility]) CM/SW Contact:  Alvaro Louder, LCSW Phone Number: 10/09/2023, 10:35 AM   Clinical Narrative:   ISRAEL discussed PT recommendation of HH Patient was agreeable, however the patient insurance did not cover HH fully. The patient did not want to pay co-pays attached to those [facility] agencies. LCSWA reached faxed out OP Rehab over to [facility] services and started service for patient. The patient reported that he would have a friend come pick him up at discharge.  TOC signing off   Final next level of care: OP Rehab Barriers to Discharge: No Barriers Identified   Patient Goals and CMS Choice            Discharge Placement              Patient chooses bed at:  (Home) Patient to be transferred to facility by: Friend Name of family member notified: Self Patient and family notified of of transfer: 10/09/23  Discharge Plan and Services Additional resources added to the After Visit Summary for       Post Acute Care Choice: IP Rehab                               Social Drivers of Health (SDOH) Interventions SDOH Screenings   Food Insecurity: No Food Insecurity (10/05/2023)  Housing: Low Risk  (10/05/2023)  Transportation Needs: No Transportation Needs (10/05/2023)  Utilities: Not At Risk (10/05/2023)  Depression (PHQ2-9): Medium Risk (08/10/2023)  Social Connections: Moderately Isolated (10/05/2023)  Tobacco Use: Low Risk  (10/04/2023)     Readmission Risk Interventions     No data to display

## 2023-10-09 NOTE — Progress Notes (Deleted)
 error

## 2023-10-09 NOTE — Plan of Care (Signed)

## 2023-10-11 ENCOUNTER — Other Ambulatory Visit: Payer: Self-pay

## 2023-10-11 DIAGNOSIS — M4802 Spinal stenosis, cervical region: Secondary | ICD-10-CM

## 2023-10-11 DIAGNOSIS — M5000 Cervical disc disorder with myelopathy, unspecified cervical region: Secondary | ICD-10-CM

## 2023-10-16 ENCOUNTER — Encounter: Admitting: Physician Assistant

## 2023-10-16 NOTE — Progress Notes (Unsigned)
   REFERRING PHYSICIAN:  Sal Labella, Md [address] [place],  [place] [address]  DOS: 10/04/23  ACDF C3-C5 with C4 corpectomy for cervical myelopathy   HISTORY OF PRESENT ILLNESS: Hector Armstrong is 2 weeks status post above surgery. Given oxycodone , robaxin , and steroid taper on discharge from the hospital.   He has minimal neck pain, but continues with constant pain, numbness, tingling, and burning in both hands. Feels like this may be worse since the surgery. No swallowing issues.   He is taking oxycodone  (maybe bid) and prn robaxin . He finished steroids he was given at discharge.   PHYSICAL EXAMINATION:  NEUROLOGICAL:  General: In no acute distress.   Awake, alert, oriented to person, place, and time.  Pupils equal round and reactive to light.  Facial tone is symmetric.    Strength: Side Biceps Triceps Deltoid Interossei Grip Wrist Ext. Wrist Flex.  R 5 5 5 5  4- 4- 4-  L 5 5 5  4- 4 4- 4-   Incision c/d/I  Sensation is diminished from wrist into hands bilaterally.   Imaging:  Cervical xrays dated 10/17/23:  No complications noted.  Report for above xrays not yet available.  Xrays reviewed with Dr. Claudene.   Assessment / Plan: Hector Armstrong is doing reasonably well s/p above surgery. Treatment options reviewed with patient and following plan made:   - We discussed activity escalation and I have advised the patient to lift up to 10 pounds until 6 weeks after surgery (until follow up with Dr. Claudene).   - Reviewed wound care.  - Okay to get OTC soft collar to wear for comfort.  - Discussed that pain hand should improve with time.  - Continue current medications including prn oxycodone  and prn robaxin . Discussed taking oxycodone  more frequently for pain. He's only doing bid right now.  - Follow up as scheduled in 4 weeks and prn. Will need xrays prior to that visit.   BP was sightly elevated. No symptoms of chest pain, shortness of breath, blurry vision, or  headaches.   Advised to contact the office if any questions or concerns arise.   Glade Boys PA-C Dept of Neurosurgery

## 2023-10-17 ENCOUNTER — Ambulatory Visit (INDEPENDENT_AMBULATORY_CARE_PROVIDER_SITE_OTHER): Admitting: Orthopedic Surgery

## 2023-10-17 ENCOUNTER — Encounter: Payer: Self-pay | Admitting: Orthopedic Surgery

## 2023-10-17 ENCOUNTER — Ambulatory Visit
Admission: RE | Admit: 2023-10-17 | Discharge: 2023-10-17 | Disposition: A | Source: Ambulatory Visit | Attending: Physician Assistant | Admitting: Physician Assistant

## 2023-10-17 ENCOUNTER — Ambulatory Visit
Admission: RE | Admit: 2023-10-17 | Discharge: 2023-10-17 | Disposition: A | Discharge status: Home / Self Care | Attending: Physician Assistant | Admitting: Physician Assistant

## 2023-10-17 VITALS — BP 148/88 | Temp 98.4°F | Ht 71.0 in | Wt 129.6 lb

## 2023-10-17 DIAGNOSIS — Z981 Arthrodesis status: Secondary | ICD-10-CM

## 2023-10-17 DIAGNOSIS — M4802 Spinal stenosis, cervical region: Secondary | ICD-10-CM

## 2023-10-17 DIAGNOSIS — S14104D Unspecified injury at C4 level of cervical spinal cord, subsequent encounter: Secondary | ICD-10-CM

## 2023-10-17 DIAGNOSIS — M5000 Cervical disc disorder with myelopathy, unspecified cervical region: Secondary | ICD-10-CM | POA: Diagnosis present

## 2023-10-17 DIAGNOSIS — S14105S Unspecified injury at C5 level of cervical spinal cord, sequela: Secondary | ICD-10-CM

## 2023-10-17 NOTE — Patient Instructions (Signed)
 It was nice to see you today.   I am sorry you are not feeling better yet. Pain in hands should improved over time.   Okay to get incision wet in the shower, do not submerge in pool or hot tub.   Call if any concerns about the incision such as redness, drainage, or fever/chills.   You can get a soft cervical collar at [facility] and wear for comfort. No lifting over 10 pounds.   Let me know if you need a refill of your oxycodone  or methocarbamol .   We will see you back in 4 weeks for your 6 weeks postop visit. Will call and let you know where/when to go for xrays prior to that visit. You may be able to come to our office.   Please call with any questions or concerns.   Glade Boys PA-C [phone]     The physicians and staff at [facility] at [facility] are committed to providing excellent care. You may receive a survey asking for feedback about your experience at our office. We value you your feedback and appreciate you taking the time to to fill it out. [facility] leadership team is also available to discuss your experience in person, feel free to contact us  [phone].

## 2023-10-31 ENCOUNTER — Encounter: Admitting: Neurosurgery

## 2023-11-07 ENCOUNTER — Other Ambulatory Visit: Payer: Self-pay

## 2023-11-07 DIAGNOSIS — S14105S Unspecified injury at C5 level of cervical spinal cord, sequela: Secondary | ICD-10-CM

## 2023-11-07 DIAGNOSIS — M4802 Spinal stenosis, cervical region: Secondary | ICD-10-CM

## 2023-11-12 ENCOUNTER — Other Ambulatory Visit

## 2023-11-12 ENCOUNTER — Encounter: Admitting: Neurosurgery

## 2023-11-13 ENCOUNTER — Other Ambulatory Visit: Payer: Self-pay

## 2023-11-13 ENCOUNTER — Other Ambulatory Visit (HOSPITAL_COMMUNITY): Payer: Self-pay

## 2023-11-13 ENCOUNTER — Other Ambulatory Visit: Payer: Self-pay | Admitting: Pharmacist

## 2023-11-13 DIAGNOSIS — B20 Human immunodeficiency virus [HIV] disease: Secondary | ICD-10-CM

## 2023-11-13 MED ORDER — CABOTEGRAVIR & RILPIVIRINE ER 600 & 900 MG/3ML IM SUER
1.0000 | INTRAMUSCULAR | 5 refills | Status: AC
  Filled 2023-11-13: qty 6, 30d supply, fill #0
  Filled 2023-12-25: qty 6, 30d supply, fill #1

## 2023-11-13 NOTE — Progress Notes (Signed)
 Specialty Pharmacy Refill Coordination Note  Hector Armstrong is a 48 y.o. male assessed today regarding refills of clinic administered specialty medication(s) Cabotegravir  & Rilpivirine  (CABENUVA )   Clinic requested Courier to Provider Office   Delivery date: 11/19/23   Verified address: [address] [place] [address]   Medication will be filled on 11/16/23.

## 2023-11-16 ENCOUNTER — Other Ambulatory Visit: Payer: Self-pay

## 2023-11-19 ENCOUNTER — Telehealth: Payer: Self-pay

## 2023-11-19 NOTE — Telephone Encounter (Signed)
 [facility] Patient Advocate Encounter  Patient's medications Cabenuva  have been couriered to [facility] from [facility] and will be administered at the patients appointment on 11/22/23.  Hector Armstrong, CPhT Specialty Pharmacy Patient [facility] for Infectious Disease Phone: [phone] Fax:  [phone]
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
## 2023-11-22 ENCOUNTER — Other Ambulatory Visit: Payer: Self-pay

## 2023-11-22 ENCOUNTER — Ambulatory Visit (INDEPENDENT_AMBULATORY_CARE_PROVIDER_SITE_OTHER): Payer: Self-pay | Admitting: Pharmacist

## 2023-11-22 ENCOUNTER — Other Ambulatory Visit (HOSPITAL_COMMUNITY)
Admission: RE | Admit: 2023-11-22 | Discharge: 2023-11-22 | Disposition: A | Discharge status: Home / Self Care | Source: Ambulatory Visit | Attending: Infectious Diseases | Admitting: Infectious Diseases

## 2023-11-22 DIAGNOSIS — Z113 Encounter for screening for infections with a predominantly sexual mode of transmission: Secondary | ICD-10-CM | POA: Diagnosis present

## 2023-11-22 DIAGNOSIS — B20 Human immunodeficiency virus [HIV] disease: Secondary | ICD-10-CM | POA: Diagnosis not present

## 2023-11-22 MED ORDER — CABOTEGRAVIR & RILPIVIRINE ER 600 & 900 MG/3ML IM SUER
1.0000 | Freq: Once | INTRAMUSCULAR | Status: AC
  Administered 2023-11-22: 1 via INTRAMUSCULAR

## 2023-11-23 LAB — URINE CYTOLOGY ANCILLARY ONLY
Chlamydia: NEGATIVE
Comment: NEGATIVE
Comment: NORMAL
Neisseria Gonorrhea: NEGATIVE

## 2023-11-28 LAB — HIV-1 RNA QUANT-NO REFLEX-BLD
HIV 1 RNA Quant: NOT DETECTED {copies}/mL
HIV-1 RNA Quant, Log: NOT DETECTED {Log_copies}/mL

## 2023-11-28 LAB — RPR: RPR Ser Ql: REACTIVE — AB

## 2023-11-28 LAB — RPR TITER: RPR Titer: 1:1 {titer} — ABNORMAL HIGH

## 2023-11-28 LAB — T PALLIDUM AB: T Pallidum Abs: POSITIVE — AB

## 2023-12-10 ENCOUNTER — Encounter: Admitting: Neurosurgery

## 2023-12-12 ENCOUNTER — Encounter: Admitting: Physician Assistant

## 2023-12-21 ENCOUNTER — Telehealth: Payer: Self-pay | Admitting: Physician Assistant

## 2023-12-21 NOTE — Telephone Encounter (Signed)
 Patient would like to know if we can extend his leave from work until February due to the nerve damage in his hands. He states he saw a hand specialist which was not very helpful but he is waiting to get an EMG completed to figure out what is going on further.  He would like to know if Lyle can make a letter extending his leave. Please advise.

## 2023-12-24 ENCOUNTER — Encounter: Payer: Self-pay | Admitting: Family Medicine

## 2023-12-25 ENCOUNTER — Ambulatory Visit

## 2023-12-25 ENCOUNTER — Encounter: Payer: Self-pay | Admitting: Physician Assistant

## 2023-12-25 ENCOUNTER — Other Ambulatory Visit: Payer: Self-pay

## 2023-12-25 ENCOUNTER — Other Ambulatory Visit (HOSPITAL_COMMUNITY): Payer: Self-pay

## 2023-12-25 ENCOUNTER — Ambulatory Visit: Admitting: Physician Assistant

## 2023-12-25 VITALS — BP 152/106 | Temp 98.9°F | Ht 71.0 in | Wt 135.1 lb

## 2023-12-25 DIAGNOSIS — M4802 Spinal stenosis, cervical region: Secondary | ICD-10-CM

## 2023-12-25 DIAGNOSIS — S14105S Unspecified injury at C5 level of cervical spinal cord, sequela: Secondary | ICD-10-CM

## 2023-12-25 DIAGNOSIS — R29898 Other symptoms and signs involving the musculoskeletal system: Secondary | ICD-10-CM

## 2023-12-25 DIAGNOSIS — M4712 Other spondylosis with myelopathy, cervical region: Secondary | ICD-10-CM

## 2023-12-25 MED ORDER — GABAPENTIN 300 MG PO CAPS: 300.0000 mg | ORAL_CAPSULE | Freq: Three times a day (TID) | ORAL | 2 refills | Status: AC

## 2023-12-25 NOTE — Progress Notes (Signed)
 Specialty Pharmacy Refill Coordination Note  Hector Armstrong is a 48 y.o. male assessed today regarding refills of clinic administered specialty medication(s) Cabotegravir  & Rilpivirine  (CABENUVA )   Clinic requested Courier to Provider Office   Delivery date: 01/14/24   Verified address: [address] [place] [address]   Medication will be filled on 01/11/24.

## 2023-12-25 NOTE — Progress Notes (Signed)
   REFERRING PHYSICIAN:  Sal Labella, Md [address] [place],  [place] [address]  DOS: 10/04/23  ACDF C3-C5 with C4 corpectomy for cervical myelopathy   HISTORY OF PRESENT ILLNESS: Hector Armstrong is 12 weeks status post above surgery.  His neck pain is gone however he is frustrated with being in bed and.  Left worse than right.  This pain keeps him up at night he feels stiff.   PHYSICAL EXAMINATION:  NEUROLOGICAL:  General: In no acute distress.   Awake, alert, oriented to person, place, and time.  Pupils equal round and reactive to light.  Facial tone is symmetric.    Strength: Side Biceps Triceps Deltoid Interossei Grip Wrist Ext. Wrist Flex.  R 5 5 5 5  4- 4- 4-  L 5 5 5  4- 4 4- 4-   Incision c/d/I  Sensation is diminished from wrist into hands bilaterally.   Imaging:  Cervical xrays dated 10/17/23:   EXAM: CERVICAL SPINE - 2-3 VIEW   COMPARISON:  Cervical spine radiograph 10/04/2023; cervical spine CT 08/29/2023   FINDINGS: Patient status post anterior cervical corpectomy of C4 with anterior cervical spinal fusion C3-C5. Hardware appears intact. Normal anatomic alignment. Mild degenerative disc disease C5-6. Preservation the vertebral body heights. Prevertebral soft tissues are unremarkable. Lung apices are clear.   IMPRESSION: Status post anterior cervical corpectomy of C4 with anterior cervical spinal fusion C3-C5.  Additional x-rays to be completed today in clinic.  Assessment / Plan: Hector Armstrong is status post C3-5 ACDF and C4 corpectomy for cervical myelopathy about 12 weeks ago. He is continuing to have significant pain, numbness, tingling and left worse than right.  His neck pain and ambulation have improved.     - Plan to start occupational hand therapy for continued weakness. -Patient is getting EMG elsewhere.  Advised patient if he would like us  to review results then we would be happy to.  Patient was counseled on the long recovery in the  setting of cervical myelopathy and expectations moving forward. - Start gabapentin  for his neuropathic pain. Risks of this medication were reviewed at length - Follow up as scheduled in 6 weeks and prn. Will need xrays prior to that visit.    Advised to contact the office if any questions or concerns arise.   Lyle Decamp PA-C Dept of Neurosurgery

## 2023-12-26 ENCOUNTER — Other Ambulatory Visit (HOSPITAL_COMMUNITY): Payer: Self-pay

## 2024-01-01 ENCOUNTER — Ambulatory Visit: Payer: Self-pay | Admitting: Neurosurgery

## 2024-01-04 ENCOUNTER — Encounter: Payer: Self-pay | Admitting: Acute Care

## 2024-01-04 ENCOUNTER — Telehealth: Payer: Self-pay | Admitting: Physician Assistant

## 2024-01-04 NOTE — Telephone Encounter (Signed)
 Spoke with patient. He went to [facility] on [address], advised him we will send for the record of EMG as we are not able to see that in his chart. He stated he was told he has pinched nerve in his neck. His job is asking for more information as far as what is the treatment plan for the patient. Advised as per recent fax to his job on the disability form-12/31/2023-pending further work up to know what the plan is, Dr Claudene can review EMG results once we have them and go from there.  Requested EMG report for the patient.

## 2024-01-04 NOTE — Telephone Encounter (Signed)
 The patient contacted the office to report that he visited [facility] and underwent a nerve conduction test, which indicated the presence of a pinched nerve. He was not provided with a treatment plan and is requesting that someone contact him to discuss recommended next steps for treatment.

## 2024-01-08 NOTE — Telephone Encounter (Signed)
 I called [facility] on [address] and left a message requesting EMG report to be sent to us 

## 2024-01-11 ENCOUNTER — Other Ambulatory Visit: Payer: Self-pay

## 2024-01-13 NOTE — Therapy (Unsigned)
 " OUTPATIENT OCCUPATIONAL THERAPY ORTHO EVALUATION  Patient Name: Hector Armstrong MRN: [id number] DOB:Jan 09, 1976, 48 y.o., male Today's Date: 01/14/2024  PCP: Dr Hector Armstrong PROVIDER: Ulis Armstrong  END OF SESSION:  OT End of Session - 01/14/24 1435     Visit Number 1    Number of Visits 12    Date for Recertification  02/25/24    OT Start Time 1435    OT Stop Time 1538    OT Time Calculation (min) 63 min    Activity Tolerance Patient tolerated treatment well    Behavior During Therapy WFL for tasks assessed/performed          Past Medical History:  Diagnosis Date   Depression    History of syphilis    HIV (human immunodeficiency virus infection) (HCC)    IBS (irritable bowel syndrome)    Peripheral neuropathy    Spinal cord injury at C5-C7 level without injury of spinal bone (HCC)    Spinal stenosis in cervical region    Past Surgical History:  Procedure Laterality Date   ANTERIOR CERVICAL CORPECTOMY N/A 10/04/2023   Procedure: C4 Corpectomy, C3-5 Artrhodesis and cage placement, Graft Harvest, Anterior C3-5 Plating;  Surgeon: Hector Penne ORN, MD;  Location: [facility] ORS;  Service: Neurosurgery;  Laterality: N/A;  C4 Corpectomy, C3-5 Arthrodedis and cage placement, Graft Harvest, Anterior C3-5 Plating   COLONOSCOPY     HERNIA REPAIR     Patient Active Problem List   Diagnosis Date Noted   Cervical myelopathy (HCC) 10/04/2023   Spinal cord injury at C5-C7 level without injury of spinal bone (HCC) 08/31/2023   Spinal stenosis in cervical region 08/31/2023   Intervertebral cervical disc disorder with myelopathy, cervical region 08/31/2023   HIV disease (HCC) 08/10/2023   Health care maintenance 08/10/2023   Medication management 08/10/2023   Vaccine counseling 08/10/2023   Medication monitoring encounter 07/17/2016   Hereditary and idiopathic peripheral neuropathy 12/21/2015   Screening examination for venereal disease 11/19/2014   Encounter for long-term (current)  use of medications 11/19/2014   Irritable bowel syndrome 10/10/2012   History of syphilis 06/19/2006   Human immunodeficiency virus (HIV) disease (HCC) 06/14/2006    ONSET DATE: 10/04/23  REFERRING DIAG: R C3-5 ACDF and C4 Corpectomy   THERAPY DIAG:  Other disturbances of skin sensation  Wrist joint stiffness, bilateral  Muscle weakness (generalized)  Other lack of coordination  Rationale for Evaluation and Treatment: Rehabilitation  SUBJECTIVE:   SUBJECTIVE STATEMENT: I had my accident in August.  And surgery middle September.  I had a nerve conduction test the other day.  Because both my hands up to my elbow still numb.  I cannot use my hands.  I drop things I cannot grip things. Pt accompanied by: self  PERTINENT HISTORY: 12/25/23 Neurosurgery appt  IMPRESSION: Status post anterior cervical corpectomy of C4 with anterior cervical spinal fusion C3-C5.   Additional x-rays to be completed today in clinic.   Assessment / Plan: [name] is status post C3-5 ACDF and C4 corpectomy for cervical myelopathy about 12 weeks ago. He is continuing to have significant pain, numbness, tingling and left worse than right.  His neck pain and ambulation have improved.       - Plan to start occupational hand therapy for continued weakness. -Patient is getting EMG elsewhere.  Advised patient if he would like us  to review results then we would be happy to.  Patient was counseled on the long recovery in the setting of cervical myelopathy  and expectations moving forward. - Start gabapentin  for his neuropathic pain. Risks of this medication were reviewed at length - Follow up as scheduled in 6 weeks and prn. Will need xrays prior to that visit.   PRECAUTIONS: Cervical surgery middle Sept    WEIGHT BEARING RESTRICTIONS: No  PAIN:  Are you having pain? 4/10 tenderness and Positive tinel bilateral Cubital tunnel  FALLS: Has patient fallen in last 6 months? 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  HAND DOMINANCE: Right  ADLs: Need some help with tieing shoes, open jars, dropping things- take long to do buttons- cannot lift anything heavy ,   FUNCTIONAL OUTCOME MEASURES: PRWHE pain and function - to be done next session  UPPER EXTREMITY ROM:     Active ROM Right eval Left eval  Shoulder flexion    Shoulder abduction    Shoulder adduction    Shoulder extension    Shoulder internal rotation    Shoulder external rotation    Elbow flexion    Elbow extension    Wrist flexion 90 90  Wrist extension 54 55  Wrist ulnar deviation 30 30  Wrist radial deviation 20 25  Wrist pronation 90 90  Wrist supination 90 90  (Blank rows = not tested) Patient with 4/10 tenderness over bilateral cubital tunnel. Positive Tinel.  Active ROM Right eval Left eval  Thumb MCP (0-60)    Thumb IP (0-80)    Thumb Radial abd/add (0-55)     Thumb Palmar abd/add (0-45)     Thumb Opposition to Small Finger     Index MCP (0-90)     Index PIP (0-100)     Index DIP (0-70)      Long MCP (0-90)      Long PIP (0-100)      Long DIP (0-70)      Ring MCP (0-90)      Ring PIP (0-100)      Ring DIP (0-70)      Little MCP (0-90)      Little PIP (0-100)      Little DIP (0-70)      (Blank rows = not tested) Increased stiffness with tendon glides right worse than left. Thumb palmar radial abduction within normal limits Opposition to all digits increased stiffness right worse than the left During 3 and 2-point pinch or fine motor patient not using left second digit.  Patient report old injury  HAND FUNCTION: Grip strength: Right: 58 lbs; Left: 65 lbs, Lateral pinch:  Right: 20 lbs, Left: 15 lbs, and 3 point pinch: Right: 15 lbs, Left: 12 lbs  COORDINATION: 9 Hole Peg test: Right: 25.72 sec; Left: 43.55 sec but not using her 2nd digit  SENSATION: To be assess further - pt report numbness in bil hands - up to elbow - L worse than R   EDEMA: none notice  COGNITION: Overall cognitive status: Within functional limits for tasks assessed    TREATMENT DATE: 01/14/24  Modalities: Fluidotherapy:  Time: 8 Location: Bilateral hands and wrist Decrease stiffness and increase active range of motion Bilateral digits and wrist and forearm range of motion  Recommend for patient to do heat prior to range of motion and home exercises to 3 times a day Soft tissue mobilization and massage to right forearm roller for volar forearm 20 reps each right and left Followed by wrist extension stretch prior stretch 10 reps hold 3 to 5 seconds 1 pound weight of 16 ounce hammer for supination and pronation.  And ulnar radial deviation.  12 reps Ulnar nerve glides 5 reps right and left  Tender glides Focusing on motion 10 reps to 3 times a day Opposition to all digits picking up 1 cm foam block alternating digits 8 reps    Provided patient with education and handout and reviewed for cubital tunnel. Follow-up positioning to avoid as well as modification to joint protection.  Patient verbalized understanding    PATIENT EDUCATION: Education details: findings of eval and HEP  Person educated: Patient Education method: Explanation, Demonstration, Tactile cues, Verbal cues, and Handouts Education comprehension: verbalized understanding, returned demonstration, verbal cues required, and needs further education    GOALS: Goals reviewed with patient? Yes  SHORT TERM GOALS: Target date: 2 weeks  Patient independent in home program to increase wrist  extension and verbalize/demonstrate modifications in correct positioning for cubital tunnel Baseline: No knowledge of home program or positioning on modifications for cubital tunnel Goal status: INITIAL   LONG TERM GOALS: Target date: 6 wks  Patient's bilateral shoulder/ elbow strength improved for patient to reach overhead in cabinets, pushing/pulling door and turning doorknobs with no increase symptoms Baseline: Patient favors bilateral upper extremity Avoid reaching for objects , guarding when sitting down; carrying hands close to body Goal status: INITIAL  2.  Grip strength in bilateral hands improved to within normal range for his age to carry groceries up to a gallon of milk with no increase symptoms Baseline: Right grip 58 pounds left 65 pounds.  Patient reports dropping objects. Goal status: INITIAL  3.  Fine motor coordination improved with 5 seconds in bilateral hands to perform fasteners and tying shoes with more ease Baseline: Nine-hole peg test for the left 43.55 seconds and left 25.72 seconds Goal status: INITIAL  4.  Continue to assess sensation and neuropathy in bilateral hands Baseline: Patient report numbness in bilateral hands, left worse than right. Goal status: INITIAL    ASSESSMENT:  CLINICAL IMPRESSION: Patient seen today for occupational therapy evaluation for R C3-5 ACDF and C4 Corpectomy On 10/04/2023.  Patient presented with guarding bilateral upper extremities close to body, not pushing from chair or reaching.  Carry hands close to body.  Patient with increased stiffness in bilateral hands with the right worse than the left as well as opposition in bilateral hands with tirade not using hand left second digit with 2-3-point pinch.  Patient with decreased grip and prehension strength.  Bilateral shoulder elbow active range of motion is within functional limits but limited in endrange decreased strength.  Patient also with tenderness over bilateral cubital tunnel  with a positive Tinel.  Patient reporting numbness in bilateral hands up to the elbows with the left worse than the right.  Patient limited in functional use of bilateral hands in ADLs and IADLs.  Patient can benefit from skilled OT services to increase range of motion, fine motor and increase strength in bilateral upper extremities continue to assess neuropathy and numbness.   PERFORMANCE DEFICITS:  in functional skills including ADLs, IADLs, ROM, strength, pain, flexibility, decreased knowledge of use of DME, and UE functional use,   and psychosocial skills including environmental adaptation and routines and behaviors.   IMPAIRMENTS: are limiting patient from ADLs, IADLs, rest and sleep, play, leisure, and social participation.   COMORBIDITIES: has no other co-morbidities that affects occupational performance. Patient will benefit from skilled OT to address above impairments and improve overall function.  MODIFICATION OR ASSISTANCE TO COMPLETE EVALUATION: No modification of tasks or assist necessary to complete an evaluation.  OT OCCUPATIONAL PROFILE AND HISTORY: Problem focused assessment: Including review of records relating to presenting problem.  CLINICAL DECISION MAKING: LOW - limited treatment options, no task modification necessary  REHAB POTENTIAL: Good for goals  EVALUATION COMPLEXITY: Low      PLAN:  OT FREQUENCY: 1-2x/week  OT DURATION: 12 weeks  PLANNED INTERVENTIONS: 97168 OT Re-evaluation, 97535 self care/ADL training, 02889 therapeutic exercise, 97530 therapeutic activity, 97112 neuromuscular re-education, 97140 manual therapy, 97018 paraffin, 02960 fluidotherapy, 97034 contrast bath, 97032 electrical stimulation (manual), 97014 electrical stimulation unattended, 97760 Orthotic Initial, [id number] Orthotic/Prosthetic subsequent, scar mobilization, passive range of motion, energy conservation, patient/family education, and DME and/or AE instructions    CONSULTED AND AGREED  WITH PLAN OF CARE: Patient     Ancel Peters, OTR/L,CLT 01/14/2024, 4:27 PM   "

## 2024-01-14 ENCOUNTER — Telehealth: Payer: Self-pay | Admitting: Physician Assistant

## 2024-01-14 ENCOUNTER — Ambulatory Visit: Payer: Self-pay | Attending: Physician Assistant | Admitting: Occupational Therapy

## 2024-01-14 ENCOUNTER — Telehealth: Payer: Self-pay

## 2024-01-14 ENCOUNTER — Encounter: Payer: Self-pay | Admitting: Occupational Therapy

## 2024-01-14 DIAGNOSIS — R29898 Other symptoms and signs involving the musculoskeletal system: Secondary | ICD-10-CM | POA: Insufficient documentation

## 2024-01-14 DIAGNOSIS — M25632 Stiffness of left wrist, not elsewhere classified: Secondary | ICD-10-CM | POA: Insufficient documentation

## 2024-01-14 DIAGNOSIS — M25631 Stiffness of right wrist, not elsewhere classified: Secondary | ICD-10-CM | POA: Insufficient documentation

## 2024-01-14 DIAGNOSIS — R208 Other disturbances of skin sensation: Secondary | ICD-10-CM | POA: Insufficient documentation

## 2024-01-14 DIAGNOSIS — M4712 Other spondylosis with myelopathy, cervical region: Secondary | ICD-10-CM | POA: Insufficient documentation

## 2024-01-14 DIAGNOSIS — R278 Other lack of coordination: Secondary | ICD-10-CM | POA: Insufficient documentation

## 2024-01-14 DIAGNOSIS — M6281 Muscle weakness (generalized): Secondary | ICD-10-CM | POA: Insufficient documentation

## 2024-01-14 NOTE — Telephone Encounter (Signed)
 EMG report faxed as requested. There is another phone note that was sent to Dr Claudene to review EMG results.

## 2024-01-14 NOTE — Telephone Encounter (Signed)
 [facility] Patient Advocate Encounter  Patient's medications [facility]  have been couriered to [facility] from [facility] and will be administered at the patients appointment on 01/22/24.  Charmaine Sharps, CPhT [facility] Patient [facility] Phone: [phone] Fax:  [phone]

## 2024-01-14 NOTE — Telephone Encounter (Signed)
 Patient is calling to see what the next steps are that his EMG has been completed. He also states that he needs his results faxed to [facility].

## 2024-01-14 NOTE — Telephone Encounter (Signed)
 EMG report is in the chart from [facility] and it shows patient made a follow up appointment with Dr Claudene on 02/13/2024. Does anything need to be done before that appointment based on EMG report?

## 2024-01-15 NOTE — Telephone Encounter (Signed)
 Patient advised and he would like to stay with our office for treatments, virtual visit scheduled.

## 2024-01-18 ENCOUNTER — Telehealth: Admitting: Neurosurgery

## 2024-01-22 ENCOUNTER — Ambulatory Visit: Admitting: Infectious Diseases

## 2024-01-22 ENCOUNTER — Other Ambulatory Visit (HOSPITAL_COMMUNITY): Payer: Self-pay

## 2024-01-22 ENCOUNTER — Other Ambulatory Visit: Payer: Self-pay

## 2024-01-22 ENCOUNTER — Ambulatory Visit: Attending: Physician Assistant | Admitting: Occupational Therapy

## 2024-01-22 ENCOUNTER — Ambulatory Visit: Admitting: Occupational Therapy

## 2024-01-22 DIAGNOSIS — M25631 Stiffness of right wrist, not elsewhere classified: Secondary | ICD-10-CM | POA: Insufficient documentation

## 2024-01-22 DIAGNOSIS — M25632 Stiffness of left wrist, not elsewhere classified: Secondary | ICD-10-CM | POA: Insufficient documentation

## 2024-01-22 DIAGNOSIS — R208 Other disturbances of skin sensation: Secondary | ICD-10-CM | POA: Insufficient documentation

## 2024-01-23 NOTE — Telephone Encounter (Signed)
 Pt is calling back in about next steps and sx decision.

## 2024-01-23 NOTE — Telephone Encounter (Signed)
 New insurance is added into the chart.

## 2024-01-24 ENCOUNTER — Ambulatory Visit: Admitting: Occupational Therapy

## 2024-01-24 NOTE — Therapy (Incomplete)
 " OUTPATIENT OCCUPATIONAL THERAPY ORTHO TREATMENT  Patient Name: Hector Armstrong MRN: [id number] DOB:Apr 28, 1975, 49 y.o., male Today's Date: 01/24/2024  PCP: Dr Hector Armstrong [facility] PROVIDER: Ulis PA  END OF SESSION:    Past Medical History:  Diagnosis Date   Depression    History of syphilis    HIV (human immunodeficiency virus infection) (HCC)    IBS (irritable bowel syndrome)    Peripheral neuropathy    Spinal cord injury at C5-C7 level without injury of spinal bone (HCC)    Spinal stenosis in cervical region    Past Surgical History:  Procedure Laterality Date   ANTERIOR CERVICAL CORPECTOMY N/A 10/04/2023   Procedure: C4 Corpectomy, C3-5 Artrhodesis and cage placement, Graft Harvest, Anterior C3-5 Plating;  Surgeon: Hector Penne ORN, MD;  Location: [facility] OR[facility];  Service: Neurosurgery;  Laterality: N/A;  C4 Corpectomy, C3-5 Arthrodedis and cage placement, Graft Harvest, Anterior C3-5 Plating   COLONOSCOPY     HERNIA REPAIR     Patient Active Problem List   Diagnosis Date Noted   Cervical myelopathy (HCC) 10/04/2023   Spinal cord injury at C5-C7 level without injury of spinal bone (HCC) 08/31/2023   Spinal stenosis in cervical region 08/31/2023   Intervertebral cervical disc disorder with myelopathy, cervical region 08/31/2023   HIV disease (HCC) 08/10/2023   Health care maintenance 08/10/2023   Medication management 08/10/2023   Vaccine counseling 08/10/2023   Medication monitoring encounter 07/17/2016   Hereditary and idiopathic peripheral neuropathy 12/21/2015   Screening examination for venereal disease 11/19/2014   Encounter for long-term (current) use of medications 11/19/2014   Irritable bowel syndrome 10/10/2012   History of syphilis 06/19/2006   Human immunodeficiency virus (HIV) disease (HCC) 06/14/2006    ONSET DATE: 10/04/23  REFERRING DIAG: R C3-5 ACDF and C4 Corpectomy   THERAPY DIAG:  No diagnosis found.  Rationale for Evaluation and Treatment:  Rehabilitation  SUBJECTIVE:   SUBJECTIVE STATEMENT: *** Pt accompanied by: self  PERTINENT HISTORY: 12/25/23 Neurosurgery appt  IMPRESSION: Status post anterior cervical corpectomy of C4 with anterior cervical spinal fusion C3-C5.   Additional x-rays to be completed today in clinic.   Assessment / Plan: Hector Armstrong is status post C3-5 ACDF and C4 corpectomy for cervical myelopathy about 12 weeks ago. He is continuing to have significant pain, numbness, tingling and left worse than right.  His neck pain and ambulation have improved.      - Plan to start occupational hand therapy for continued weakness. -Patient is getting EMG elsewhere.  Advised patient if he would like us  to review results then we would be happy to.  Patient was counseled on the long recovery in the setting of cervical myelopathy and expectations moving forward. - Start gabapentin  for his neuropathic pain. Risks of this medication were reviewed at length - Follow up as scheduled in 6 weeks and prn. Will need xrays prior to that visit.   PRECAUTIONS: Cervical surgery middle Sept    WEIGHT BEARING RESTRICTIONS: No  PAIN:  Are you having pain? ***4/10 tenderness and Positive tinel bilateral Cubital tunnel  FALLS: Has patient fallen in last 6 months? 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  HAND DOMINANCE:  Right  ADLs: Need some help with tieing shoes, open jars, dropping things- take long to do buttons- cannot lift anything heavy ,   FUNCTIONAL OUTCOME MEASURES: PRWHE pain and function - to be done next session  UPPER EXTREMITY ROM:     Active ROM Right eval Left eval  Shoulder flexion     Shoulder abduction    Shoulder adduction    Shoulder extension    Shoulder internal rotation    Shoulder external rotation    Elbow flexion    Elbow extension    Wrist flexion 90 90  Wrist extension 54 55  Wrist ulnar deviation 30 30  Wrist radial deviation 20 25  Wrist pronation 90 90  Wrist supination 90 90  (Blank rows = not tested) Patient with 4/10 tenderness over bilateral cubital tunnel. Positive Tinel.  Increased stiffness with tendon glides right worse than left. Thumb palmar radial abduction within normal limits Opposition to all digits increased stiffness right worse than the left During 3 and 2-point pinch or fine motor patient not using left second digit.  Patient report old injury  HAND FUNCTION: Grip strength: Right: 58 lbs; Left: 65 lbs, Lateral pinch: Right: 20 lbs, Left: 15 lbs, and 3 point pinch: Right: 15 lbs, Left: 12 lbs  COORDINATION: 9 Hole Peg test: Right: 25.72 sec; Left: 43.55 sec but not using his 2nd digit  SENSATION: To be assess further - pt report numbness in bil hands - up to elbow - L worse than R   EDEMA: none notice  COGNITION: Overall cognitive status: Within functional limits for tasks assessed    TREATMENT DATE: 01/24/24                                                                                                                            Modalities: Fluidotherapy:  Time: 8 Location: Bilateral hands and wrist Decrease stiffness and increase active range of motion Bilateral digits and wrist and forearm range of motion  Recommend for patient to do heat prior to range of motion and home exercises to 3 times a day Soft tissue mobilization and massage to right forearm roller for volar forearm 20 reps each right and left Followed by wrist extension stretch prior stretch 10 reps hold 3 to 5 seconds 1 pound weight of 16 ounce hammer for supination and pronation.  And ulnar radial deviation.  12 reps Ulnar nerve glides 5 reps right  and left  Tender glides Focusing on motion 10 reps to 3 times a day Opposition to all digits picking up 1 cm foam block alternating digits 8 reps    Provided patient with education and handout and reviewed for cubital tunnel. Follow-up positioning to avoid as well as modification to joint protection.  Patient verbalized understanding    PATIENT EDUCATION: Education details: findings of eval and HEP  Person educated: Patient Education method: Explanation, Demonstration, Tactile cues, Verbal cues, and Handouts Education comprehension: verbalized understanding, returned demonstration, verbal  cues required, and needs further education    GOALS: Goals reviewed with patient? Yes  SHORT TERM GOALS: Target date: 2 weeks  Patient independent in home program to increase wrist extension and verbalize/demonstrate modifications in correct positioning for cubital tunnel Baseline: No knowledge of home program or positioning on modifications for cubital tunnel Goal status: INITIAL   LONG TERM GOALS: Target date: 6 wks  Patient's bilateral shoulder/ elbow strength improved for patient to reach overhead in cabinets, pushing/pulling door and turning doorknobs with no increase symptoms Baseline: Patient favors bilateral upper extremity Avoid reaching for objects , guarding when sitting down; carrying hands close to body Goal status: INITIAL  2.  Grip strength in bilateral hands improved to within normal range for his age to carry groceries up to a gallon of milk with no increase symptoms Baseline: Right grip 58 pounds left 65 pounds.  Patient reports dropping objects. Goal status: INITIAL  3.  Fine motor coordination improved with 5 seconds in bilateral hands to perform fasteners and tying shoes with more ease Baseline: Nine-hole peg test for the left 43.55 seconds and left 25.72 seconds Goal status: INITIAL  4.  Continue to assess sensation and neuropathy in bilateral hands Baseline:  Patient report numbness in bilateral hands, left worse than right. Goal status: INITIAL    ASSESSMENT:  CLINICAL IMPRESSION: Patient seen following R C3-5 ACDF and C4 Corpectomy on 10/04/2023.  ***  Patient presented with guarding bilateral upper extremities close to body, not pushing from chair or reaching.  Carry hands close to body.  Patient with increased stiffness in bilateral hands with the right worse than the left as well as opposition in bilateral hands with tirade not using hand left second digit with 2-3-point pinch.  Patient with decreased grip and prehension strength.  Bilateral shoulder elbow active range of motion is within functional limits but limited in endrange decreased strength.  Patient also with tenderness over bilateral cubital tunnel with a positive Tinel.    Patient reporting numbness in bilateral hands up to the elbows with the left worse than the right.  Patient limited in functional use of bilateral hands in ADLs and IADLs.  Patient can benefit from skilled OT services to increase range of motion, fine motor and increase strength in bilateral upper extremities continue to assess neuropathy and numbness.   PERFORMANCE DEFICITS: in functional skills including ADLs, IADLs, ROM, strength, pain, flexibility, decreased knowledge of use of DME, and UE functional use,   and psychosocial skills including environmental adaptation and routines and behaviors.   IMPAIRMENTS: are limiting patient from ADLs, IADLs, rest and sleep, play, leisure, and social participation.   COMORBIDITIES: has no other co-morbidities that affects occupational performance. Patient will benefit from skilled OT to address above impairments and improve overall function.  MODIFICATION OR ASSISTANCE TO COMPLETE EVALUATION: No modification of tasks or assist necessary to complete an evaluation.  OT OCCUPATIONAL PROFILE AND HISTORY: Problem focused assessment: Including review of records relating to  presenting problem.  CLINICAL DECISION MAKING: LOW - limited treatment options, no task modification necessary  REHAB POTENTIAL: Good for goals  EVALUATION COMPLEXITY: Low      PLAN:  OT FREQUENCY: 1-2x/week  OT DURATION: 12 weeks  PLANNED INTERVENTIONS: 97168 OT Re-evaluation, 97535 self care/ADL training, 02889 therapeutic exercise, 97530 therapeutic activity, 97112 neuromuscular re-education, 97140 manual therapy, 97018 paraffin, 02960 fluidotherapy, 97034 contrast bath, 97032 electrical stimulation (manual), 97014 electrical stimulation unattended, 97760 Orthotic Initial, [id number] Orthotic/Prosthetic subsequent, scar mobilization, passive range of motion,  energy conservation, patient/family education, and DME and/or AE instructions    CONSULTED AND AGREED WITH PLAN OF CARE: Patient     Elston JINNY Slot, OTR/L 01/24/2024, 8:25 AM   "

## 2024-01-25 NOTE — Progress Notes (Unsigned)
 "  HPI: Hector Armstrong is a 49 y.o. male who presents to the [facility] pharmacy clinic for Cabenuva  administration.  Referring ID Provider: Dr. Overton   Patient Active Problem List   Diagnosis Date Noted   Cervical myelopathy (HCC) 10/04/2023   Spinal cord injury at C5-C7 level without injury of spinal bone (HCC) 08/31/2023   Spinal stenosis in cervical region 08/31/2023   Intervertebral cervical disc disorder with myelopathy, cervical region 08/31/2023   HIV disease (HCC) 08/10/2023   Health care maintenance 08/10/2023   Medication management 08/10/2023   Vaccine counseling 08/10/2023   Medication monitoring encounter 07/17/2016   Hereditary and idiopathic peripheral neuropathy 12/21/2015   Screening examination for venereal disease 11/19/2014   Encounter for long-term (current) use of medications 11/19/2014   Irritable bowel syndrome 10/10/2012   History of syphilis 06/19/2006   Human immunodeficiency virus (HIV) disease (HCC) 06/14/2006    Patient's Medications  New Prescriptions   No medications on file  Previous Medications   ATORVASTATIN  (LIPITOR) 40 MG TABLET    Take 1 tablet (40 mg total) by mouth daily.   CABOTEGRAVIR  & RILPIVIRINE  ER (CABENUVA ) 600 & 900 MG/3ML INJECTION    Inject 1 kit into the muscle every 2 (two) months.   ELVITEGRAVIR-COBICISTAT-EMTRICITABINE-TENOFOVIR  (GENVOYA ) 150-150-200-10 MG TABS TABLET    Take 1 tablet by mouth daily with breakfast.   GABAPENTIN  (NEURONTIN ) 300 MG CAPSULE    Take 1 capsule (300 mg total) by mouth 3 (three) times daily.   MULTIPLE VITAMINS-MINERALS (MULTIVITAMIN ADULTS 50+ PO)    Take by mouth daily.  Modified Medications   No medications on file  Discontinued Medications   No medications on file    Allergies: Allergies[1]  Labs: Lab Results  Component Value Date   HIV1RNAQUANT NOT DETECTED 11/22/2023   HIV1RNAQUANT NOT DETECTED 10/02/2023   HIV1RNAQUANT NOT DETECTED 07/27/2023   CD4TABS 565 11/08/2021   CD4TABS 633  05/10/2020   CD4TABS 570 07/04/2019    RPR and STI Lab Results  Component Value Date   LABRPR REACTIVE (A) 11/22/2023   LABRPR NON-REACTIVE 07/27/2023   LABRPR NON-REACTIVE 01/05/2023   LABRPR NON-REACTIVE 05/06/2021   LABRPR REACTIVE (A) 07/04/2019   RPRTITER 1:1 (H) 11/22/2023   RPRTITER 1:1 (H) 07/04/2019   RPRTITER 1:1 (H) 01/02/2018   RPRTITER 1:1 (H) 02/20/2017   RPRTITER 1:1 05/06/2015    STI Results GC CT  11/22/2023 11:41 AM Negative  Negative   05/06/2021  4:12 PM Negative  Negative   07/04/2019  9:14 AM Negative  Negative   01/02/2018 12:00 AM Negative  Negative   06/27/2017 12:00 AM Negative  Negative   02/10/2017 12:00 AM Negative  Negative   06/23/2016 12:00 AM Negative  Negative     Hepatitis B Lab Results  Component Value Date   HEPBSAB NON-REACTIVE 08/10/2023   HEPBSAG NEG 06/14/2006   HEPBCAB POS (A) 06/14/2006   Hepatitis C Lab Results  Component Value Date   HEPCAB NON-REACTIVE 02/20/2017   Hepatitis A Lab Results  Component Value Date   HAV REACTIVE (A) 10/02/2023   Lipids: Lab Results  Component Value Date   CHOL 173 07/27/2023   TRIG 72 07/27/2023   HDL 77 07/27/2023   CHOLHDL 2.2 07/27/2023   VLDL 24 06/23/2016   LDLCALC 81 07/27/2023    Target Date: The 6th  Assessment: Hector Armstrong presents today for his maintenance Cabenuva  injections. Past injections were tolerated well without issues. Last HIV RNA was not detected in November. Doing well with no  issues today.  Lab work:  None today  Cabenuva : Administered cabotegravir  600mg /27mL in left upper outer quadrant of the gluteal muscle. Administered rilpivirine  900 mg/3mL in the right upper outer quadrant of the gluteal muscle. No issues with injections. He will follow up in 2 months for next set of injections.  Plan: - Cabenuva  injections administered - Next injections scheduled for 03/18/24 with Dr. Overton and 05/21/24 with Alan - Call with any issues or questions  Hector Walkup L.  Galen Malkowski, PharmD, BCIDP, AAHIVP, CPP Clinical Pharmacist Practitioner - Infectious Diseases Clinical Pharmacist Lead - Specialty Pharmacy [facility]     [1] No Known Allergies  "

## 2024-01-28 ENCOUNTER — Ambulatory Visit: Payer: Self-pay | Admitting: Pharmacist

## 2024-01-28 ENCOUNTER — Other Ambulatory Visit: Payer: Self-pay

## 2024-01-28 DIAGNOSIS — B20 Human immunodeficiency virus [HIV] disease: Secondary | ICD-10-CM

## 2024-01-28 MED ORDER — CABOTEGRAVIR & RILPIVIRINE ER 600 & 900 MG/3ML IM SUER
1.0000 | Freq: Once | INTRAMUSCULAR | Status: AC
  Administered 2024-01-28: 1 via INTRAMUSCULAR

## 2024-01-29 ENCOUNTER — Ambulatory Visit: Payer: Self-pay | Admitting: Neurosurgery

## 2024-01-29 ENCOUNTER — Ambulatory Visit: Admitting: Occupational Therapy

## 2024-01-29 ENCOUNTER — Telehealth: Payer: Self-pay

## 2024-01-29 ENCOUNTER — Encounter: Payer: Self-pay | Admitting: Occupational Therapy

## 2024-01-29 ENCOUNTER — Other Ambulatory Visit: Payer: Self-pay

## 2024-01-29 DIAGNOSIS — R208 Other disturbances of skin sensation: Secondary | ICD-10-CM | POA: Diagnosis present

## 2024-01-29 DIAGNOSIS — G5622 Lesion of ulnar nerve, left upper limb: Secondary | ICD-10-CM

## 2024-01-29 DIAGNOSIS — R29898 Other symptoms and signs involving the musculoskeletal system: Secondary | ICD-10-CM

## 2024-01-29 DIAGNOSIS — Z01818 Encounter for other preprocedural examination: Secondary | ICD-10-CM

## 2024-01-29 DIAGNOSIS — M25632 Stiffness of left wrist, not elsewhere classified: Secondary | ICD-10-CM

## 2024-01-29 DIAGNOSIS — M25631 Stiffness of right wrist, not elsewhere classified: Secondary | ICD-10-CM | POA: Diagnosis present

## 2024-01-29 NOTE — Therapy (Signed)
 " OUTPATIENT OCCUPATIONAL THERAPY ORTHO TREATMENT  Patient Name: Hector Armstrong MRN: [id number] DOB:Apr 06, 1975, 49 y.o., male Today's Date: 01/29/2024  PCP: Dr Constancia MART PROVIDER: Ulis PA  END OF SESSION:  OT End of Session - 01/29/24 1542     Visit Number 2    Number of Visits 12    Date for Recertification  02/25/24    OT Start Time 1540    OT Stop Time 1618    OT Time Calculation (min) 38 min    Activity Tolerance Patient tolerated treatment well    Behavior During Therapy WFL for tasks assessed/performed           Past Medical History:  Diagnosis Date   Depression    History of syphilis    HIV (human immunodeficiency virus infection) (HCC)    IBS (irritable bowel syndrome)    Peripheral neuropathy    Spinal cord injury at C5-C7 level without injury of spinal bone (HCC)    Spinal stenosis in cervical region    Past Surgical History:  Procedure Laterality Date   ANTERIOR CERVICAL CORPECTOMY N/A 10/04/2023   Procedure: C4 Corpectomy, C3-5 Artrhodesis and cage placement, Graft Harvest, Anterior C3-5 Plating;  Surgeon: Hector Penne ORN, MD;  Location: [facility] ORS;  Service: Neurosurgery;  Laterality: N/A;  C4 Corpectomy, C3-5 Arthrodedis and cage placement, Graft Harvest, Anterior C3-5 Plating   COLONOSCOPY     HERNIA REPAIR     Patient Active Problem List   Diagnosis Date Noted   Cervical myelopathy (HCC) 10/04/2023   Spinal cord injury at C5-C7 level without injury of spinal bone (HCC) 08/31/2023   Spinal stenosis in cervical region 08/31/2023   Intervertebral cervical disc disorder with myelopathy, cervical region 08/31/2023   HIV disease (HCC) 08/10/2023   Health care maintenance 08/10/2023   Medication management 08/10/2023   Vaccine counseling 08/10/2023   Medication monitoring encounter 07/17/2016   Hereditary and idiopathic peripheral neuropathy 12/21/2015   Screening examination for venereal disease 11/19/2014   Encounter for long-term (current)  use of medications 11/19/2014   Irritable bowel syndrome 10/10/2012   History of syphilis 06/19/2006   Human immunodeficiency virus (HIV) disease (HCC) 06/14/2006    ONSET DATE: 10/04/23  REFERRING DIAG: R C3-5 ACDF and C4 Corpectomy   THERAPY DIAG:  Wrist joint stiffness, bilateral  Other disturbances of skin sensation  Rationale for Evaluation and Treatment: Rehabilitation  SUBJECTIVE:   SUBJECTIVE STATEMENT: I need as much therapy as I can to get my hands moving. Pt accompanied by: self  PERTINENT HISTORY: 12/25/23 Neurosurgery appt  IMPRESSION: Status post anterior cervical corpectomy of C4 with anterior cervical spinal fusion C3-C5.   Additional x-rays to be completed today in clinic.   Assessment / Plan: Hector Armstrong is status post C3-5 ACDF and C4 corpectomy for cervical myelopathy about 12 weeks ago. He is continuing to have significant pain, numbness, tingling and left worse than right.  His neck pain and ambulation have improved.      - Plan to start occupational hand therapy for continued weakness. -Patient is getting EMG elsewhere.  Advised patient if he would like us  to review results then we would be happy to.  Patient was counseled on the long recovery in the setting of cervical myelopathy and expectations moving forward. - Start gabapentin  for his neuropathic pain. Risks of this medication were reviewed at length - Follow up as scheduled in 6 weeks and prn. Will need xrays prior to that visit.   PRECAUTIONS: Cervical surgery middle  Sept    WEIGHT BEARING RESTRICTIONS: No  PAIN:  Are you having pain? 6/10 tenderness and Positive tinel bilateral Cubital tunnel  FALLS: Has patient fallen in last 6 months? No  LIVING ENVIRONMENT: Lives with: lives with their family  PLOF: Independent prior to accident - worked as Production Designer, Theatre/television/film at [employer] - not doing much since Aug  PATIENT GOALS: Want  to be able to use my hands and arms better   NEXT MD VISIT: Early  Febr  OBJECTIVE:  Note: Objective measures were completed at Evaluation unless otherwise noted.  HAND DOMINANCE: Right  ADLs: Need some help with tieing shoes, open jars, dropping things- take long to do buttons- cannot lift anything heavy ,   FUNCTIONAL OUTCOME MEASURES: PRWHE pain and function - to be done next session 01/29/24: 62/100  UPPER EXTREMITY ROM:     Active ROM Right eval Left eval  Shoulder flexion    Shoulder abduction    Shoulder adduction    Shoulder extension    Shoulder internal rotation    Shoulder external rotation    Elbow flexion    Elbow extension    Wrist flexion 90 90  Wrist extension 54 55  Wrist ulnar deviation 30 30  Wrist radial deviation 20 25  Wrist pronation 90 90  Wrist supination 90 90  (Blank rows = not tested) Patient with 4/10 tenderness over bilateral cubital tunnel. Positive Tinel.  Increased stiffness with tendon glides right worse than left. Thumb palmar radial abduction within normal limits Opposition to all digits increased stiffness right worse than the left During 3 and 2-point pinch or fine motor patient not using left second digit.  Patient report old injury  HAND FUNCTION: Grip strength: Right: 58 lbs; Left: 65 lbs, Lateral pinch: Right: 20 lbs, Left: 15 lbs, and 3 point pinch: Right: 15 lbs, Left: 12 lbs  COORDINATION: 9 Hole Peg test: Right: 25.72 sec; Left: 43.55 sec but not using his 2nd digit  SENSATION: To be assess further - pt report numbness in bil hands - up to elbow - L worse than R  01/29/24:  4.56 unable to feel on R 2nd and 3rd digit  3.61 unable on L and R 3-5th digits    EDEMA: none notice  COGNITION: Overall cognitive status: Within functional limits for tasks assessed    TREATMENT DATE: 01/29/24                                                                                                                            Modalities: Fluidotherapy:  Time: 8 Location: Bilateral hands and  wrist Decrease stiffness and increase active range of motion Bilateral digits and wrist and forearm range of motion   Recommend for patient to do heat prior to range of motion and home exercises 3 times a day Reviewed HEP: Soft tissue mobilization and massage to right forearm roller for volar forearm  wrist extension stretch prior stretch 10 reps hold 3 to  5 seconds 1 pound weight supination and pronation 12 reps 1# weight ulnar/ radial deviation 12 reps Ulnar nerve glides 5 reps right and left Opposition to all digits picking up 1 cm foam block alternating digits 8 reps  Reviewed tendon nerve glides with cues to decrease speed and focus on good for Discussed following up with MD re: pain management as pt reports constant and increasing pain.  Reviewed joint protection strategies.  Completed sensation testing and PRWHE as above   PATIENT EDUCATION: Education details: findings of eval and HEP  Person educated: Patient Education method: Explanation, Demonstration, Tactile cues, Verbal cues, and Handouts Education comprehension: verbalized understanding, returned demonstration, verbal cues required, and needs further education    GOALS: Goals reviewed with patient? Yes  SHORT TERM GOALS: Target date: 2 weeks  Patient independent in home program to increase wrist extension and verbalize/demonstrate modifications in correct positioning for cubital tunnel Baseline: No knowledge of home program or positioning on modifications for cubital tunnel Goal status: INITIAL   LONG TERM GOALS: Target date: 6 wks  Patient's bilateral shoulder/ elbow strength improved for patient to reach overhead in cabinets, pushing/pulling door and turning doorknobs with no increase symptoms Baseline: Patient favors bilateral upper extremity Avoid reaching for objects , guarding when sitting down; carrying hands close to body Goal status: INITIAL  2.  Grip strength in bilateral hands improved to within  normal range for his age to carry groceries up to a gallon of milk with no increase symptoms Baseline: Right grip 58 pounds left 65 pounds.  Patient reports dropping objects. Goal status: INITIAL  3.  Fine motor coordination improved with 5 seconds in bilateral hands to perform fasteners and tying shoes with more ease Baseline: Nine-hole peg test for the left 43.55 seconds and left 25.72 seconds Goal status: INITIAL  4.  Continue to assess sensation and neuropathy in bilateral hands Baseline: Patient report numbness in bilateral hands, left worse than right. Goal status: INITIAL    ASSESSMENT:  CLINICAL IMPRESSION: Patient seen following R C3-5 ACDF and C4 Corpectomy on 10/04/2023. Pt presented with increased pain, completed PRWHE 62/100. Completed sensation testing, unable to feel 3.61 on L and R 3-5th digits. Reviewed HEP, cues to complete movements slowly and intentionally. Patient reporting numbness in bilateral hands up to the elbows with the left worse than the right.  Patient limited in functional use of bilateral hands in ADLs and IADLs.  Patient can benefit from skilled OT services to increase range of motion, fine motor and increase strength in bilateral upper extremities continue to assess neuropathy and numbness.   PERFORMANCE DEFICITS: in functional skills including ADLs, IADLs, ROM, strength, pain, flexibility, decreased knowledge of use of DME, and UE functional use,   and psychosocial skills including environmental adaptation and routines and behaviors.   IMPAIRMENTS: are limiting patient from ADLs, IADLs, rest and sleep, play, leisure, and social participation.   COMORBIDITIES: has no other co-morbidities that affects occupational performance. Patient will benefit from skilled OT to address above impairments and improve overall function.  MODIFICATION OR ASSISTANCE TO COMPLETE EVALUATION: No modification of tasks or assist necessary to complete an evaluation.  OT  OCCUPATIONAL PROFILE AND HISTORY: Problem focused assessment: Including review of records relating to presenting problem.  CLINICAL DECISION MAKING: LOW - limited treatment options, no task modification necessary  REHAB POTENTIAL: Good for goals  EVALUATION COMPLEXITY: Low      PLAN:  OT FREQUENCY: 1-2x/week  OT DURATION: 12 weeks  PLANNED INTERVENTIONS: 02831 OT  Re-evaluation, 97535 self care/ADL training, 02889 therapeutic exercise, 97530 therapeutic activity, 97112 neuromuscular re-education, 97140 manual therapy, 97018 paraffin, 02960 fluidotherapy, 97034 contrast bath, 97032 electrical stimulation (manual), 97014 electrical stimulation unattended, 97760 Orthotic Initial, 97763 Orthotic/Prosthetic subsequent, scar mobilization, passive range of motion, energy conservation, patient/family education, and DME and/or AE instructions    CONSULTED AND AGREED WITH PLAN OF CARE: Patient     Elston JINNY Slot, OTR/L 01/29/2024, 3:43 PM   "

## 2024-01-29 NOTE — Telephone Encounter (Signed)
 I spoke with Mr Hector Armstrong. He chose a date for his left and right. We dicussed the pre-op instructions. I will send him a mychart message with a copy of what we discussed once I receive the case posting for the right from Dr Claudene.

## 2024-01-29 NOTE — Telephone Encounter (Addendum)
 He chose 02/12/24 for his left arm and 03/11/24 for his right arm. You can give Katie and I the posting for the right.  He states he needs to know what date he could return to work without any restrictions because they will not allow him to return light duty. He will need a new letter sent via mychart with his surgery dates and this information.

## 2024-01-29 NOTE — Telephone Encounter (Signed)
 Please see below for information in regards to your upcoming surgery:   Planned surgery: Left-sided ulnar nerve decompression, Anterior interosseous nerve to ulnar nerve transfer, Right sided ulnar nerve decompression at the elbow, anterior interosseous nerve to ulnar nerve transfer, right.   Surgery date: 02/12/24 (left), 03/11/24 (right) at [facility]: [address] [place], [place] [address]) - you will find out your arrival time the business day before your surgery.   Pre-op appointment at [facility] Pre-admit Testing: you will receive a call with a date/time for this appointment. If you are scheduled for an in person appointment, Pre-admit Testing is located on the first floor of the [facility] building, [address], [address]. During this appointment, they will advise you which medications you can take the morning of surgery, and which medications you will need to hold for surgery. Labs (such as blood work, EKG) may be done at your pre-op appointment. You are not required to fast for these labs. Should you need to change your pre-op appointment, please call Pre-admit testing at [phone].   Please bring your medication bottles or an up to date medication list to your pre-admit testing appointment (regardless of whether we have a list in your chart).     How to contact us :  If you have any questions/concerns before or after surgery, you can reach us  at [phone], or you can send a mychart message. We can be reached by phone or mychart 8am-4pm, Monday-Friday.  *Please note: Calls after 4pm are forwarded to a third party answering service. Mychart messages are not routinely monitored during evenings, weekends, and holidays. Please call our office to contact the answering service for urgent concerns during non-business hours.    If you have FMLA/disability paperwork, please drop it off or fax it to  [phone]   Appointments/FMLA & disability paperwork: Reche Hait, & Nichole Registered Nurse/Surgery scheduler: Karlene Southard, RN & Katie, RN Certified Medical Assistants: Don, CMA, Elenor, CMA, Damien, CMA, & Auston, [place] Physician Assistants: Lyle Decamp, PA-C, Edsel Goods, PA-C & Glade Boys, PA-C Surgeons: Penne Sharps, MD & Reeves Daisy, MD   [facility] PREADMIT TESTING VISIT and SURGERY INFORMATION SHEET   Now that surgery has been scheduled you can anticipate several phone calls from [facility] services. A pharmacy technician will call you to verify your current list of medications taken at home.               The Pre-Service Center will call to verify your insurance information and to give you billing estimates and information.             The Preadmit Testing Office will be calling to schedule a visit to obtain information for the anesthesia team and provide instructions on preparation for surgery.  What can you expect for the Preadmit Testing Visit: Appointments may be scheduled in-person or by telephone.  If a telephone visit is scheduled, you may be asked to come into the office to have lab tests or other studies performed.   This visit will not be completed any greater than 14 days prior to your surgery.  If your surgery has been scheduled for a future date, please do not be alarmed if we have not contacted you to schedule an appointment more than a month prior to the surgery date.    Please be prepared to provide the following information during this appointment:            -  Personal medical history                                               -Medication and allergy list            -Any history of problems with anesthesia              -Recent lab work or diagnostic studies            -Please notify us  of any needs we should be aware of to provide the best care possible           -You will be provided with instructions on how to  prepare for your surgery.    On The Day of Surgery:  You must have a driver to take you home after surgery, you will be asked not to drive for 24 hours following surgery.  Taxi, Gisele and non-medical transport will not be acceptable means of transportation unless you have a responsible individual who will be traveling with you.  Visitors in the surgical area:   2 people will be able to visit you in your room once your preparation for surgery has been completed. During surgery, your visitors will be asked to wait in the Surgery Waiting Area.  It is not a requirement for them to stay, if they prefer to leave and come back.  Your visitor(s) will be given an update once the surgery has been completed.  No visitors are allowed in the initial recovery room to respect patient privacy and safety.  Once you are more awake and transfer to the secondary recovery area, or are transferred to an inpatient room, visitors will again be able to see you.  To respect and protect your privacy: We will ask on the day of surgery who your driver will be and what the contact number for that individual will be. We will ask if it is okay to share information with this individual, or if there is an alternative individual that we, or the surgeon, should contact to provide updates and information. If family or friends come to the surgical information desk requesting information about you, who you have not listed with us , no information will be given.   It may be helpful to designate someone as the main contact who will be responsible for updating your other friends and family.    PREADMIT TESTING OFFICE: [phone] SAME DAY SURGERY: [phone] We look forward to caring for you before and throughout the process of your surgery.

## 2024-01-31 ENCOUNTER — Telehealth: Payer: Self-pay

## 2024-01-31 ENCOUNTER — Ambulatory Visit: Payer: Self-pay | Admitting: Neurosurgery

## 2024-01-31 ENCOUNTER — Other Ambulatory Visit: Payer: Self-pay

## 2024-01-31 DIAGNOSIS — Z01818 Encounter for other preprocedural examination: Secondary | ICD-10-CM

## 2024-01-31 DIAGNOSIS — G5621 Lesion of ulnar nerve, right upper limb: Secondary | ICD-10-CM

## 2024-01-31 NOTE — Telephone Encounter (Signed)
 Updated surgery instructions sent to patient in mychart to include both left and right sided surgeries.  Planned surgery: Left-sided ulnar nerve decompression, Anterior interosseous nerve to ulnar nerve transfer, Right sided ulnar nerve decompression at the elbow, anterior interosseous nerve to ulnar nerve transfer, right     Surgery date: 02/12/24 (left), 03/11/24 (right) at [facility]: [address], [place], [place] [address]) - you will find out your arrival time the business day before your surgery.     Pre-op appointment at [facility] Pre-admit Testing: you will receive a call with a date/time for this appointment. If you are scheduled for an in person appointment, Pre-admit Testing is located on the first floor of the [facility] building, [address], [address]. During this appointment, they will advise you which medications you can take the morning of surgery, and which medications you will need to hold for surgery. Labs (such as blood work, EKG) may be done at your pre-op appointment. You are not required to fast for these labs. Should you need to change your pre-op appointment, please call Pre-admit testing at [phone].    Please bring your medication bottles or an up to date medication list to your pre-admit testing appointment (regardless of whether we have a list in your chart).         How to contact us :  If you have any questions/concerns before or after surgery, you can reach us  at [phone], or you can send a mychart message. We can be reached by phone or mychart 8am-4pm, Monday-Friday.  *Please note: Calls after 4pm are forwarded to a third party answering service. Mychart messages are not routinely monitored during evenings, weekends, and holidays. Please call our office to contact the answering service for urgent concerns during non-business hours.       If you have FMLA/disability paperwork, please drop it off  or fax it to [phone]     Appointments/FMLA & disability paperwork: Reche Hait, & Nichole Registered Nurse/Surgery scheduler: Kendelyn, RN & Katie, RN Certified Medical Assistants: Don, CMA, Elenor, CMA, Damien, CMA, & Auston, [place] Physician Assistants: Lyle Decamp, PA-C, Edsel Goods, PA-C & Glade Boys, PA-C Surgeons: Penne Sharps, MD & Reeves Daisy, MD     [facility] PREADMIT TESTING VISIT and SURGERY INFORMATION SHEET    Now that surgery has been scheduled you can anticipate several phone calls from [facility] services. A pharmacy technician will call you to verify your current list of medications taken at home.               The Pre-Service Center will call to verify your insurance information and to give you billing estimates and information.             The Preadmit Testing Office will be calling to schedule a visit to obtain information for the anesthesia team and provide instructions on preparation for surgery.   What can you expect for the Preadmit Testing Visit: Appointments may be scheduled in-person or by telephone.  If a telephone visit is scheduled, you may be asked to come into the office to have lab tests or other studies performed.   This visit will not be completed any greater than 14 days prior to your surgery.  If your surgery has been scheduled for a future date, please do not be alarmed if we have not contacted you to schedule an appointment more than a month prior to the surgery date.  Please be prepared to provide the following information during this appointment:            -Personal medical history                                               -Medication and allergy list            -Any history of problems with anesthesia              -Recent lab work or diagnostic studies            -Please notify us  of any needs we should be aware of to provide the best care possible           -You will be provided with  instructions on how to prepare for your surgery.       On The Day of Surgery:   You must have a driver to take you home after surgery, you will be asked not to drive for 24 hours following surgery.  Taxi, Gisele and non-medical transport will not be acceptable means of transportation unless you have a responsible individual who will be traveling with you.   Visitors in the surgical area:   2 people will be able to visit you in your room once your preparation for surgery has been completed. During surgery, your visitors will be asked to wait in the Surgery Waiting Area.  It is not a requirement for them to stay, if they prefer to leave and come back.  Your visitor(s) will be given an update once the surgery has been completed.   No visitors are allowed in the initial recovery room to respect patient privacy and safety.   Once you are more awake and transfer to the secondary recovery area, or are transferred to an inpatient room, visitors will again be able to see you.   To respect and protect your privacy: We will ask on the day of surgery who your driver will be and what the contact number for that individual will be. We will ask if it is okay to share information with this individual, or if there is an alternative individual that we, or the surgeon, should contact to provide updates and information. If family or friends come to the surgical information desk requesting information about you, who you have not listed with us , no information will be given.   It may be helpful to designate someone as the main contact who will be responsible for updating your other friends and family.     PREADMIT TESTING OFFICE: [phone] SAME DAY SURGERY: [phone] We look forward to caring for you before and throughout the process of your surgery.

## 2024-01-31 NOTE — Telephone Encounter (Signed)
 What date can he return to work with full restrictions?

## 2024-01-31 NOTE — Telephone Encounter (Signed)
 Sent patient mychart message in separate encounter for second surgery scheduling

## 2024-02-04 ENCOUNTER — Ambulatory Visit: Admitting: Occupational Therapy

## 2024-02-06 ENCOUNTER — Encounter
Admission: RE | Admit: 2024-02-06 | Discharge: 2024-02-06 | Disposition: A | Discharge status: Home / Self Care | Source: Ambulatory Visit | Attending: Neurosurgery | Admitting: Neurosurgery

## 2024-02-06 NOTE — Patient Instructions (Addendum)
 Your procedure is scheduled on: 02/12/24 - Tuesday Report to the Registration Desk on the 1st floor of the [facility]. To find out your arrival time, please call [phone] between 1PM - 3PM on: 02/11/24 - Monday If your arrival time is 6:00 am, do not arrive before that time as the [facility] entrance doors do not open until 6:00 am.  REMEMBER: Instructions that are not followed completely may result in serious medical risk, up to and including death; or upon the discretion of your surgeon and anesthesiologist your surgery may need to be rescheduled.  Do not eat food after midnight the night before surgery.  No gum chewing or hard candies.  You may however, drink CLEAR liquids up to 2 hours before you are scheduled to arrive for your surgery. Do not drink anything within 2 hours of your scheduled arrival time.  Clear liquids include: - water   - apple juice without pulp - gatorade (not RED colors) - black coffee or tea (Do NOT add milk or creamers to the coffee or tea) Do NOT drink anything that is not on this list.  One week prior to surgery: Stop Anti-inflammatories (NSAIDS) such as Advil, Aleve, Ibuprofen, Motrin, Naproxen, Naprosyn and Aspirin based products such as Excedrin, Goody's Powder, BC Powder.You may take Tylenol  if needed for pain up until the day of surgery.   Stop ANY OVER THE COUNTER supplements until after surgery : multivitamin  ON THE DAY OF SURGERY ONLY TAKE THESE MEDICATIONS WITH SIPS OF WATER :  gabapentin  (NEURONTIN )   No Alcohol for 24 hours before or after surgery.  No Smoking including e-cigarettes for 24 hours before surgery.  No chewable tobacco products for at least 6 hours before surgery.  No nicotine patches on the day of surgery.  Do not use any recreational drugs for at least a week (preferably 2 weeks) before your surgery.  Please be advised that the combination of cocaine and anesthesia may have negative outcomes, up to and including  death. If you test positive for cocaine, your surgery will be cancelled.  On the morning of surgery brush your teeth with toothpaste and water , you may rinse your mouth with mouthwash if you wish. Do not swallow any toothpaste or mouthwash.  Use CHG Soap or wipes as directed on instruction sheet.  Do not wear jewelry, make-up, hairpins, clips or nail polish.  For welded (permanent) jewelry: bracelets, anklets, waist bands, etc.  Please have this removed prior to surgery.  If it is not removed, there is a chance that hospital personnel will need to cut it off on the day of surgery.  Do not wear lotions, powders, or perfumes.   Do not shave body hair from the neck down 48 hours before surgery.  Contact lenses, hearing aids and dentures may not be worn into surgery.  Do not bring valuables to the hospital. [facility] is not responsible for any missing/lost belongings or valuables.   Notify your doctor if there is any change in your medical condition (cold, fever, infection).  Wear comfortable clothing (specific to your surgery type) to the hospital.  After surgery, you can help prevent lung complications by doing breathing exercises.  Take deep breaths and cough every 1-2 hours. Your doctor may order a device called an Incentive Spirometer to help you take deep breaths.  If you are being admitted to the hospital overnight, leave your suitcase in the car. After surgery it may be brought to your room.  In case of  increased patient census, it may be necessary for you, the patient, to continue your postoperative care in the Same Day Surgery department.  If you are being discharged the day of surgery, you will not be allowed to drive home. You will need a responsible individual to drive you home and stay with you for 24 hours after surgery.   If you are taking public transportation, you will need to have a responsible individual with you.  Please call the Pre-admissions Testing Dept. at  [phone] if you have any questions about these instructions.  Surgery Visitation Policy:  Patients having surgery or a procedure may have two visitors.  Children under the age of 65 must have an adult with them who is not the patient.  Inpatient Visitation:    Visiting hours are 7 a.m. to 8 p.m. Up to four visitors are allowed at one time in a patient room. The visitors may rotate out with other people during the day.  One visitor age 78 or older may stay with the patient overnight and must be in the room by 8 p.m.   Merchandiser, Retail to address health-related social needs:  [email or web address]                                                                                                            Preparing for Surgery with CHLORHEXIDINE  GLUCONATE (CHG) Soap  Chlorhexidine  Gluconate (CHG) Soap  o An antiseptic cleaner that kills germs and bonds with the skin to continue killing germs even after washing  o Used for showering the night before surgery and morning of surgery  Before surgery, you can play an important role by reducing the number of germs on your skin.  CHG (Chlorhexidine  gluconate) soap is an antiseptic cleanser which kills germs and bonds with the skin to continue killing germs even after washing.  Please do not use if you have an allergy to CHG or antibacterial soaps. If your skin becomes reddened/irritated stop using the CHG.  1. Shower the NIGHT BEFORE SURGERY with CHG soap.  2. If you choose to wash your hair, wash your hair first as usual with your normal shampoo.  3. After shampooing, rinse your hair and body thoroughly to remove the shampoo.  4. Use CHG as you would any other liquid soap. You can apply CHG directly to the skin and wash gently with a clean washcloth.  5. Apply the CHG soap to your body only from the neck down. Do not use on open wounds or open sores. Avoid contact with your eyes, ears, mouth, and genitals  (private parts). Wash face and genitals (private parts) with your normal soap.  6. Wash thoroughly, paying special attention to the area where your surgery will be performed.  7. Thoroughly rinse your body with warm water .  8. Do not shower/wash with your normal soap after using and rinsing off the CHG soap.  9. Do not use lotions, oils, etc., after showering with CHG.  10. Pat yourself dry with a clean towel.  11. Wear  clean pajamas to bed the night before surgery.  12. Place clean sheets on your bed the night of your shower and do not sleep with pets.  13. Do not apply any deodorants/lotions/powders.  14. Please wear clean clothes to the hospital.  15. Remember to brush your teeth with your regular toothpaste.

## 2024-02-08 ENCOUNTER — Ambulatory Visit: Admitting: Occupational Therapy

## 2024-02-08 ENCOUNTER — Telehealth: Payer: Self-pay

## 2024-02-08 NOTE — Telephone Encounter (Signed)
 I spoke with Hector Armstrong and confirmed that he is aware that our office will be closed Monday and that if he will be late or unable to make it in for surgery on Tuesday, he needs to call the OR at [phone] to notify them. At this time, he is still planning on coming in.

## 2024-02-08 NOTE — Telephone Encounter (Signed)
 Called patient on behalf of the [facility] OR, letting him know that if he is able to make it to the hospital, we plan to proceed with surgery as scheduled. If he will be late or unable to make it for any reason, provided patient with [facility] OR telephone number [phone]. Patient was unavailable, so left voice mail.

## 2024-02-11 MED ORDER — LACTATED RINGERS IV SOLN: INTRAVENOUS | Status: DC

## 2024-02-11 MED ORDER — ORAL CARE MOUTH RINSE: 15.0000 mL | Freq: Once | OROMUCOSAL | Status: AC

## 2024-02-11 MED ORDER — CHLORHEXIDINE GLUCONATE 0.12 % MT SOLN
15.0000 mL | Freq: Once | OROMUCOSAL | Status: AC
  Administered 2024-02-12: 15 mL via OROMUCOSAL

## 2024-02-12 ENCOUNTER — Ambulatory Visit
Admission: RE | Admit: 2024-02-12 | Discharge: 2024-02-12 | Disposition: A | Discharge status: Home / Self Care | Source: Ambulatory Visit | Attending: Neurosurgery | Admitting: Neurosurgery

## 2024-02-12 ENCOUNTER — Other Ambulatory Visit: Payer: Self-pay

## 2024-02-12 ENCOUNTER — Other Ambulatory Visit (HOSPITAL_COMMUNITY): Payer: Self-pay

## 2024-02-12 ENCOUNTER — Encounter
Admission: RE | Disposition: A | Discharge status: Home / Self Care | Payer: Self-pay | Source: Ambulatory Visit | Attending: Neurosurgery

## 2024-02-12 ENCOUNTER — Ambulatory Visit: Admitting: Anesthesiology

## 2024-02-12 DIAGNOSIS — R29898 Other symptoms and signs involving the musculoskeletal system: Secondary | ICD-10-CM | POA: Diagnosis present

## 2024-02-12 DIAGNOSIS — Z21 Asymptomatic human immunodeficiency virus [HIV] infection status: Secondary | ICD-10-CM | POA: Insufficient documentation

## 2024-02-12 DIAGNOSIS — Z01818 Encounter for other preprocedural examination: Secondary | ICD-10-CM

## 2024-02-12 DIAGNOSIS — G5622 Lesion of ulnar nerve, left upper limb: Secondary | ICD-10-CM | POA: Diagnosis not present

## 2024-02-12 MED ORDER — PROPOFOL 500 MG/50ML IV EMUL
INTRAVENOUS | Status: DC | PRN
  Administered 2024-02-12: 145 ug/kg/min via INTRAVENOUS

## 2024-02-12 MED ORDER — SURGIFLO WITH THROMBIN (HEMOSTATIC MATRIX KIT) OPTIME
TOPICAL | Status: DC | PRN
  Administered 2024-02-12: 1 via TOPICAL

## 2024-02-12 MED ORDER — OXYCODONE HCL 5 MG PO TABS: 5.0000 mg | ORAL_TABLET | Freq: Once | ORAL | Status: DC | PRN

## 2024-02-12 MED ORDER — 0.9 % SODIUM CHLORIDE (POUR BTL) OPTIME
TOPICAL | Status: DC | PRN
  Administered 2024-02-12: 500 mL

## 2024-02-12 MED ORDER — ESMOLOL HCL 100 MG/10ML IV SOLN
INTRAVENOUS | Status: DC | PRN
  Administered 2024-02-12 (×2): 30 mg via INTRAVENOUS

## 2024-02-12 MED ORDER — GLYCOPYRROLATE 0.2 MG/ML IJ SOLN
INTRAMUSCULAR | Status: DC | PRN
  Administered 2024-02-12: .2 mg via INTRAVENOUS

## 2024-02-12 MED ORDER — BUPIVACAINE-EPINEPHRINE 0.5% -1:200000 IJ SOLN
INTRAMUSCULAR | Status: DC | PRN
  Administered 2024-02-12: 10 mL

## 2024-02-12 MED ORDER — MIDAZOLAM HCL (PF) 2 MG/2ML IJ SOLN
INTRAMUSCULAR | Status: DC | PRN
  Administered 2024-02-12: 2 mg via INTRAVENOUS

## 2024-02-12 MED ORDER — LACTATED RINGERS IV SOLN: INTRAVENOUS | Status: DC

## 2024-02-12 MED ORDER — FENTANYL CITRATE (PF) 100 MCG/2ML IJ SOLN: 25.0000 ug | INTRAMUSCULAR | Status: DC | PRN

## 2024-02-12 MED ORDER — ORAL CARE MOUTH RINSE: 15.0000 mL | Freq: Once | OROMUCOSAL | Status: DC

## 2024-02-12 MED ORDER — CEFAZOLIN SODIUM-DEXTROSE 2-4 GM/100ML-% IV SOLN
INTRAVENOUS | Status: AC
  Filled 2024-02-12: qty 100

## 2024-02-12 MED ORDER — TISSEEL 10 ML EX KIT
PACK | CUTANEOUS | Status: DC | PRN
  Administered 2024-02-12: 2 mL

## 2024-02-12 MED ORDER — TRAMADOL HCL 50 MG PO TABS
50.0000 mg | ORAL_TABLET | Freq: Four times a day (QID) | ORAL | 0 refills | Status: AC | PRN
  Filled 2024-02-12 (×2): qty 12, 3d supply, fill #0

## 2024-02-12 MED ORDER — MIDAZOLAM HCL 2 MG/2ML IJ SOLN
INTRAMUSCULAR | Status: AC
  Filled 2024-02-12: qty 2

## 2024-02-12 MED ORDER — OXYCODONE HCL 5 MG/5ML PO SOLN: 5.0000 mg | Freq: Once | ORAL | Status: DC | PRN

## 2024-02-12 MED ORDER — KETAMINE HCL 10 MG/ML IJ SOLN
INTRAMUSCULAR | Status: DC | PRN
  Administered 2024-02-12: 20 mg via INTRAVENOUS
  Administered 2024-02-12: 30 mg via INTRAVENOUS

## 2024-02-12 MED ORDER — BUPIVACAINE-EPINEPHRINE (PF) 0.5% -1:200000 IJ SOLN
INTRAMUSCULAR | Status: AC
  Filled 2024-02-12: qty 10

## 2024-02-12 MED ORDER — DEXAMETHASONE SOD PHOSPHATE PF 10 MG/ML IJ SOLN
INTRAMUSCULAR | Status: DC | PRN
  Administered 2024-02-12: 10 mg via INTRAVENOUS

## 2024-02-12 MED ORDER — ACETAMINOPHEN 10 MG/ML IV SOLN
INTRAVENOUS | Status: DC | PRN
  Administered 2024-02-12: 1000 mg via INTRAVENOUS

## 2024-02-12 MED ORDER — METHYLPREDNISOLONE ACETATE 40 MG/ML IJ SUSP
INTRAMUSCULAR | Status: AC
  Filled 2024-02-12: qty 1

## 2024-02-12 MED ORDER — FENTANYL CITRATE (PF) 100 MCG/2ML IJ SOLN
INTRAMUSCULAR | Status: AC
  Filled 2024-02-12: qty 2

## 2024-02-12 MED ORDER — CEFAZOLIN SODIUM-DEXTROSE 2-4 GM/100ML-% IV SOLN
2.0000 g | INTRAVENOUS | Status: AC
  Administered 2024-02-12: 2 g via INTRAVENOUS

## 2024-02-12 MED ORDER — CHLORHEXIDINE GLUCONATE 0.12 % MT SOLN: 15.0000 mL | Freq: Once | OROMUCOSAL | Status: DC

## 2024-02-12 MED ORDER — CEFAZOLIN IN SODIUM CHLORIDE 2-0.9 GM/100ML-% IV SOLN
2.0000 g | Freq: Once | INTRAVENOUS | Status: DC
  Filled 2024-02-12: qty 100

## 2024-02-12 MED ORDER — EPHEDRINE SULFATE-NACL 50-0.9 MG/10ML-% IV SOSY
PREFILLED_SYRINGE | INTRAVENOUS | Status: DC | PRN
  Administered 2024-02-12 (×3): 5 mg via INTRAVENOUS

## 2024-02-12 MED ORDER — KETAMINE HCL 50 MG/5ML IJ SOSY
PREFILLED_SYRINGE | INTRAMUSCULAR | Status: AC
  Filled 2024-02-12: qty 5

## 2024-02-12 MED ORDER — ONDANSETRON HCL 4 MG/2ML IJ SOLN
INTRAMUSCULAR | Status: DC | PRN
  Administered 2024-02-12 (×2): 4 mg via INTRAVENOUS

## 2024-02-12 MED ORDER — CHLORHEXIDINE GLUCONATE 0.12 % MT SOLN
OROMUCOSAL | Status: AC
  Filled 2024-02-12: qty 15

## 2024-02-12 MED ORDER — PROPOFOL 1000 MG/100ML IV EMUL
INTRAVENOUS | Status: AC
  Filled 2024-02-12: qty 100

## 2024-02-12 NOTE — Anesthesia Postprocedure Evaluation (Signed)
"   Anesthesia Post Note  Patient: Hector Armstrong  Procedure(s) Performed: Left-sided ulnar nerve decompression (Left: Arm Lower) Anterior interosseous nerve to ulnar nerve transfer (Left: Arm Lower)  Patient location during evaluation: PACU Anesthesia Type: General Level of consciousness: awake and alert Pain management: pain level controlled Vital Signs Assessment: post-procedure vital signs reviewed and stable Respiratory status: spontaneous breathing, nonlabored ventilation, respiratory function stable and patient connected to nasal cannula oxygen Cardiovascular status: blood pressure returned to baseline and stable Postop Assessment: no apparent nausea or vomiting Anesthetic complications: no   No notable events documented.   Last Vitals:  Vitals:   02/12/24 0948 02/12/24 1011  BP: (!) 174/110 (!) 170/109  Pulse: 69 61  Resp: 15 15  Temp:  36.5 C  SpO2: 100% 100%    Last Pain:  Vitals:   02/12/24 1011  TempSrc: Temporal  PainSc: 0-No pain                 Lendia LITTIE Mae      "

## 2024-02-12 NOTE — Transfer of Care (Signed)
 Immediate Anesthesia Transfer of Care Note  Patient: Hector Armstrong  Procedure(s) Performed: Left-sided ulnar nerve decompression (Left: Arm Lower) Anterior interosseous nerve to ulnar nerve transfer (Left: Arm Lower)  Patient Location: PACU  Anesthesia Type:General  Level of Consciousness: drowsy and patient cooperative  Airway & Oxygen Therapy: Patient Spontanous Breathing and Patient connected to face mask oxygen  Post-op Assessment: Report given to RN and Post -op Vital signs reviewed and stable  Post vital signs: Reviewed and stable  Last Vitals:  Vitals Value Taken Time  BP    Temp    Pulse    Resp    SpO2      Last Pain:  Vitals:   02/12/24 0658  TempSrc: Temporal         Complications: No notable events documented.

## 2024-02-12 NOTE — Interval H&P Note (Signed)
 History and Physical Interval Note:  02/12/2024 7:15 AM  Hector Armstrong  has presented today for surgery, with the diagnosis of Severe ulnar neuropathy.  The various methods of treatment have been discussed with the patient and family. After consideration of risks, benefits and other options for treatment, the patient has consented to  Procedures: Left-sided ulnar nerve decompression (Left) Anterior interosseous nerve to ulnar nerve transfer (Left) as a surgical intervention.  The patient's history has been reviewed, patient examined, no change in status, stable for surgery.  I have reviewed the patient's chart and labs.  Questions were answered to the patient's satisfaction.    Heart and lungs clear   Penne LELON Sharps

## 2024-02-12 NOTE — Op Note (Signed)
 Indications: Hector Armstrong is a 49 y.o. male with history of progressive left-sided ulnar neuropathy and hand weakness refractory to conservative management.  Severe wasting of his ulnar intrinsic muscles.  Slight activation of his ulnar intrinsic muscles on physical examination and severe sensory loss in his ulnar distribution.   Findings: Severe compression of the ulnar nerve just distal to the medial epicondyle secondary to significant amount of excess connective tissue.   Strong coaptation between AIN and ulnar motor division   Preoperative Diagnosis: Severe ulnar neuropathy, left  Postoperative Diagnosis: Severe ulnar neuropathy, left     EBL: 10cc IVF: See anesthesia report Drains: none Disposition:Stable to PACU Complications: none   No foley catheter was placed.     Preoperative Note: 49 y.o. male with a history of progressive left-sided ulnar neuropathy and hand weakness refractory to conservative management.  They had tried rest, padding, and watchful waiting but had continued progressive symptoms.  Given the progression of their ulnar neuropathy plan was made for ulnar nerve decompression without transposition. Given the severe wasting of his ulnar intrinsic musculature we recommended a AIN to ulnar nerve transfer   Risk of surgery is discussed and include: Infection, bleeding, wound healing issues, nerve injury, pain, failure to relieve the symptoms, need for further surgery.   Procedure:  1) left ulnar nerve decompression at the elbow 2) left AIN to ulnar end to side transfer 3) Microsurgery              Procedure: After obtaining informed consent, the patient taken to the operating room, placed in supine position, monitored anesthesia care was induced.  They were given preoperative antibiotics.  Prepped and draped in the usual fashion.  Comprehensive timeout was performed verifying the patient's name, MRN, planned procedure.   The humerus was padded, elbow was externally  rotated, a curvilinear incision over the medial aspect of the elbow was planned.  This went proximal to the medial epicondyle as well as distal to cover the space from the cubital tunnel to the intermuscular septum/arcade of Struthers.   Local anesthetic was injected, skin was opened sharply, the skin was dissected down to the subcutaneous fascia.  Care was taken not to sacrifice any cutaneous nerves while doing this dissection.  We are able to feel and palpate the intermuscular septum.  This was where we are able to identify the nerve initially.  The nerve was underneath the septum but not being compressed by it, it was however being compressed by a arcade of Struthers.  This was divided sharply.   We then continued to follow the nerve distally decompressing on the way.  We found it in the retrocondylar groove, we removed all the soft tissue overlying.  We then continue to follow it distally.  We are able to identify the fascia overlying the heads of the FCU, this was in excess of normal findings, and was quite thickened and compressive.  This was divided sharply.  We are then able to split the heads of the FCU and identify Osborne's band.  We then divided this.  At this point the nerve was well decompressed from proximal to the arcade of Struthers to distal to Osborne's band.  There were no areas of ongoing compression.  The nerve did not dislocate.  When the elbow was bent there were no evidence of any ongoing compression.  The wound was copiously irrigated.  The wound was then closed in multiple layers.  Sterile dressing was applied.   We then turned our  attention to the nerve transfer portion of the procedure.  We utilized an ultrasound to find the level of the pronator quadratus and the forearm, we fashioned an incision over this.  Skin and subcutaneous tissues were infiltrated with local anesthetic.  It was opened sharply.  We continued dissection till we saw the FCU tendon.  On the medial aspect of  this we followed the plane down and were able to identify the ulnar artery and ulnar nerve.  We isolated the ulnar nerve away from the ulnar vasculature.  We then continued to retract the flexor tendons laterally until we are able to reach the level of the ulna as we retracted laterally we are then able to identify the leading edge of the pronator quadratus.  Going into this we saw the microvasculature and saw the nerve to the AIN going through this.  We isolated the nerve to the AIN under microscopic visualization. Using microsurgical techniques we neurolysed the AIN branch to the pronator quadratus and followed distally until the branching pattern was noticed. It was divided distally. We brought this up to the ulnar nerve in order to define our epineurial window. Using microscissors we dissected the epineurium over the motor branch to the ulnar nerve.    Using 8-0 nylon we performed an end to side coaptation finalizing the nerve transfer. We secured the coaptation with tissue glue.    No immediate complications.   Sponge and pattie counts were correct at the end of the procedure.   Hector Goods, PA-C assisted in the procedure. An assistant was required for this procedure due to the complexity.  The assistant provided assistance in tissue manipulation and suction, and was required for the successful and safe performance of the procedure. I performed the critical portions of the procedure.   [name], MD/MSCR

## 2024-02-12 NOTE — Discharge Instructions (Addendum)

## 2024-02-12 NOTE — Anesthesia Preprocedure Evaluation (Addendum)
 "                                  Anesthesia Evaluation  Patient identified by MRN, date of birth, ID band Patient awake    Reviewed: Allergy & Precautions, NPO status , Patient's Chart, lab work & pertinent test results  History of Anesthesia Complications Negative for: history of anesthetic complications  Airway Mallampati: I  TM Distance: >3 FB Neck ROM: full    Dental no notable dental hx.    Pulmonary neg pulmonary ROS   Pulmonary exam normal        Cardiovascular negative cardio ROS Normal cardiovascular exam     Neuro/Psych  PSYCHIATRIC DISORDERS  Depression     Neuromuscular disease    GI/Hepatic negative GI ROS, Neg liver ROS,,,  Endo/Other  negative endocrine ROS    Renal/GU negative Renal ROS  negative genitourinary   Musculoskeletal negative musculoskeletal ROS (+)    Abdominal   Peds  Hematology  (+) HIV  Anesthesia Other Findings Past Medical History: No date: Depression No date: History of syphilis No date: HIV (human immunodeficiency virus infection) (HCC) No date: IBS (irritable bowel syndrome) No date: Peripheral neuropathy No date: 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) No date: Spinal stenosis in cervical region  Past Surgical History: 10/04/2023: ANTERIOR CERVICAL CORPECTOMY; N/A     Comment:  Procedure: C4 Corpectomy, C3-5 Artrhodesis and cage               placement, Graft Harvest, Anterior C3-5 Plating;                Surgeon: Claudene Penne ORN, MD;  Location: [facility] OR[facility];                Service: Neurosurgery;  Laterality: N/A;  C4 Corpectomy,               C3-5 Arthrodedis and cage placement, Graft Harvest,               Anterior C3-5 Plating No date: COLONOSCOPY No date: HERNIA REPAIR  BMI    Body Mass Index: 20.22 kg/m      Reproductive/Obstetrics negative OB ROS                              Anesthesia Physical Anesthesia Plan  ASA: 2  Anesthesia  Plan: General   Post-op Pain Management: Toradol  IV (intra-op)* and Ofirmev  IV (intra-op)*   Induction: Intravenous  PONV Risk Score and Plan: 2 and Propofol  infusion and TIVA  Airway Management Planned: Natural Airway and Nasal Cannula  Additional Equipment:   Intra-op Plan:   Post-operative Plan:   Informed Consent: I have reviewed the patients History and Physical, chart, labs and discussed the procedure including the risks, benefits and alternatives for the proposed anesthesia with the patient or authorized representative who has indicated his/her understanding and acceptance.     Dental Advisory Given  Plan Discussed with: Anesthesiologist, CRNA and Surgeon  Anesthesia Plan Comments: (Patient consented for risks of anesthesia including but not limited to:  - adverse reactions to medications - risk of airway placement if required - damage to eyes, teeth, lips or other oral mucosa - nerve damage due to positioning  - sore throat or hoarseness - Damage to heart, brain, nerves, lungs, other parts of body or loss  of life  Patient voiced understanding and assent.)         Anesthesia Quick Evaluation  "

## 2024-02-12 NOTE — H&P (Signed)
 "   Referring Physician:  Claudene Penne ORN, MD [address] [place],  [place] [address]  Primary Physician:  Sal Labella, MD  History of Present Illness: 02/12/2024 [name] is here today with a chief complaint of severe ulnar neuropathy.  He has had progressive disability in his upper extremity.  Had recent EMG with severe ulnar neuropathy bilaterally.  Left worse than right clinically.  Plan to go forward with a left-sided ulnar nerve decompression and AIN to ulnar nerve transfer.    Review of Systems:  A 10 point review of systems is negative, except for the pertinent positives and negatives detailed in the HPI.  Past Medical History: Past Medical History:  Diagnosis Date   Depression    History of syphilis    HIV (human immunodeficiency virus infection) (HCC)    IBS (irritable bowel syndrome)    Peripheral neuropathy    Spinal cord injury at C5-C7 level without injury of spinal bone (HCC)    Spinal stenosis in cervical region     Past Surgical History: Past Surgical History:  Procedure Laterality Date   ANTERIOR CERVICAL CORPECTOMY N/A 10/04/2023   Procedure: C4 Corpectomy, C3-5 Artrhodesis and cage placement, Graft Harvest, Anterior C3-5 Plating;  Surgeon: Claudene Penne ORN, MD;  Location: [facility];  Service: Neurosurgery;  Laterality: N/A;  C4 Corpectomy, C3-5 Arthrodedis and cage placement, Graft Harvest, Anterior C3-5 Plating   COLONOSCOPY     HERNIA REPAIR      Allergies: Allergies as of 01/29/2024   (No Known Allergies)    Medications: Current Medications[1]  Social History: Social History[2]  Family Medical History: No family history on file.  Physical Examination: Vitals:   02/12/24 0658  BP: (!) 177/109  Pulse: 63  Resp: 20  Temp: 97.8 F (36.6 C)  SpO2: 95%    General: Patient is in no apparent distress. Attention to examination is appropriate.  Neck:   Supple.  Full range of motion.  Respiratory: Patient is  breathing without any difficulty.   NEUROLOGICAL:     Awake, alert, oriented to person, place, and time.  Speech is clear and fluent.   Cranial Nerves: Pupils equal round and reactive to light.  Facial tone is symmetric. Shoulder shrug is symmetric. Tongue protrusion is midline.  There is no pronator drift.  Motor Exam:  There weakness in the ulnar nerve distribution bilaterally.  Ulnar wasting.  Severe Tinel at the elbow.  Medical Decision Making  Electrodiagnostics: EMG demonstrates severe left-sided ulnar neuropathy at the elbow.  I have personally reviewed the images and electrodiagnostics and agree with the above interpretation.  Assessment and Plan: [name] is a pleasant 49 y.o. male with severe left-sided ulnar neuropathy here today with muscle wasting, weakness, numbness tingling.  Pain.  EMG demonstrated severe ulnar neuropathy at the elbow.  Hand demonstrates severe wasting.  Will plan for an ulnar nerve decompression on the left side with AIN to ulnar nerve transfer.  Risk benefits were discussed.  Patient would like to go forward with the procedure.  Thank you for involving me in the care of this patient.    Penne MICAEL Claudene MD/MSCR Neurosurgery - Peripheral Nerve Surgery      [1]  Current Facility-Administered Medications:    ceFAZolin  (ANCEF ) IVPB 2g/100 mL premix, 2 g, Intravenous, 60 min Pre-Op, Claudene Penne ORN, MD   lactated ringers  infusion, , Intravenous, Continuous, Vicci Camellia Glatter, MD [2]  Social History Tobacco Use   Smoking status: Never  Smokeless tobacco: Never  Vaping Use   Vaping status: Never Used  Substance Use Topics   Alcohol use: No   Drug use: No   "

## 2024-02-12 NOTE — Progress Notes (Signed)
 Attempted to go over discharge instructions with patient and significant other sharon [phone] but did not answer phone and will not call same day surgery back, patient expresses he does not have anyone else to go over information with, went over with patient and AVS documents printed, highlighted and explained to with patient, manager Scarlette Manes aware. About an hour later patient girlfriend called him and says she is here to pick him up, patient discharged  Patient also wants sling to go home with. Powell, RN [employer], PA-C regarding his requests and says it is not needed for this procedure. Patient also wants disability papers filled out but he will notify the doctor later this week.

## 2024-02-13 ENCOUNTER — Encounter: Payer: Self-pay | Admitting: Neurosurgery

## 2024-02-13 ENCOUNTER — Ambulatory Visit: Admitting: Neurosurgery

## 2024-02-14 ENCOUNTER — Telehealth: Payer: Self-pay | Admitting: Neurosurgery

## 2024-02-14 NOTE — Telephone Encounter (Signed)
 Wrote new note and sent to patient via mychart and printed physical copy to leave with front desk.

## 2024-02-14 NOTE — Telephone Encounter (Signed)
 Patient is calling to request a note stating that he had surgery on 02/12/2024 and that he may return to work on 05/06/2024. He would like to pick this up when ready.

## 2024-02-14 NOTE — Telephone Encounter (Signed)
 Letter was sent to patient in mychart previously. Resent and printed copy for pick up

## 2024-02-15 ENCOUNTER — Encounter: Payer: Self-pay | Admitting: Family Medicine

## 2024-02-27 ENCOUNTER — Encounter: Admitting: Physician Assistant

## 2024-02-28 ENCOUNTER — Encounter: Admitting: Neurosurgery

## 2024-03-11 ENCOUNTER — Ambulatory Visit: Admit: 2024-03-11 | Admitting: Neurosurgery

## 2024-03-11 DIAGNOSIS — G5621 Lesion of ulnar nerve, right upper limb: Secondary | ICD-10-CM | POA: Insufficient documentation

## 2024-03-11 SURGERY — ULNAR NERVE DECOMPRESSION/TRANSPOSITION
Anesthesia: Monitor Anesthesia Care | Laterality: Right

## 2024-03-18 ENCOUNTER — Ambulatory Visit: Admitting: Pharmacist

## 2024-03-18 ENCOUNTER — Ambulatory Visit: Payer: Self-pay | Admitting: Internal Medicine

## 2024-03-26 ENCOUNTER — Encounter: Admitting: Physician Assistant

## 2024-04-21 ENCOUNTER — Other Ambulatory Visit

## 2024-04-21 ENCOUNTER — Encounter

## 2024-05-21 ENCOUNTER — Ambulatory Visit: Payer: Self-pay | Admitting: Pharmacist

## 2024-06-02 ENCOUNTER — Encounter
